# Patient Record
Sex: Female | Born: 1940
Health system: Southern US, Community
[De-identification: ages and names within clinical notes are randomized; demographics above are authoritative.]

## PROBLEM LIST (undated history)

## (undated) DIAGNOSIS — K469 Unspecified abdominal hernia without obstruction or gangrene: Secondary | ICD-10-CM

## (undated) DIAGNOSIS — M1990 Unspecified osteoarthritis, unspecified site: Secondary | ICD-10-CM

## (undated) DIAGNOSIS — E063 Autoimmune thyroiditis: Secondary | ICD-10-CM

## (undated) DIAGNOSIS — E669 Obesity, unspecified: Secondary | ICD-10-CM

## (undated) DIAGNOSIS — I7300 Raynaud's syndrome without gangrene: Secondary | ICD-10-CM

## (undated) DIAGNOSIS — F32A Depression, unspecified: Secondary | ICD-10-CM

## (undated) DIAGNOSIS — E785 Hyperlipidemia, unspecified: Secondary | ICD-10-CM

## (undated) DIAGNOSIS — F329 Major depressive disorder, single episode, unspecified: Secondary | ICD-10-CM

## (undated) DIAGNOSIS — R252 Cramp and spasm: Secondary | ICD-10-CM

## (undated) DIAGNOSIS — R42 Dizziness and giddiness: Secondary | ICD-10-CM

## (undated) DIAGNOSIS — T7840XA Allergy, unspecified, initial encounter: Secondary | ICD-10-CM

## (undated) DIAGNOSIS — G4730 Sleep apnea, unspecified: Secondary | ICD-10-CM

## (undated) DIAGNOSIS — M791 Myalgia, unspecified site: Secondary | ICD-10-CM

## (undated) DIAGNOSIS — R5383 Other fatigue: Secondary | ICD-10-CM

## (undated) DIAGNOSIS — D649 Anemia, unspecified: Secondary | ICD-10-CM

## (undated) DIAGNOSIS — R0602 Shortness of breath: Secondary | ICD-10-CM

## (undated) DIAGNOSIS — M2550 Pain in unspecified joint: Secondary | ICD-10-CM

## (undated) DIAGNOSIS — B029 Zoster without complications: Secondary | ICD-10-CM

## (undated) DIAGNOSIS — H9190 Unspecified hearing loss, unspecified ear: Secondary | ICD-10-CM

## (undated) HISTORY — DX: Hyperlipidemia, unspecified: E78.5

## (undated) HISTORY — DX: Raynaud's syndrome without gangrene: I73.00

## (undated) HISTORY — DX: Depression, unspecified: F32.A

## (undated) HISTORY — DX: Other fatigue: R53.83

## (undated) HISTORY — PX: KNEE ARTHROSCOPY: SHX127

## (undated) HISTORY — DX: Unspecified osteoarthritis, unspecified site: M19.90

## (undated) HISTORY — DX: Sleep apnea, unspecified: G47.30

## (undated) HISTORY — DX: Obesity, unspecified: E66.9

## (undated) HISTORY — DX: Anemia, unspecified: D64.9

## (undated) HISTORY — PX: APPENDECTOMY: SHX54

## (undated) HISTORY — DX: Myalgia, unspecified site: M79.10

## (undated) HISTORY — DX: Unspecified hearing loss, unspecified ear: H91.90

## (undated) HISTORY — DX: Allergy, unspecified, initial encounter: T78.40XA

## (undated) HISTORY — DX: Shortness of breath: R06.02

## (undated) HISTORY — DX: Autoimmune thyroiditis: E06.3

## (undated) HISTORY — DX: Pain in unspecified joint: M25.50

## (undated) HISTORY — DX: Unspecified abdominal hernia without obstruction or gangrene: K46.9

## (undated) HISTORY — PX: TONSILLECTOMY: SUR1361

## (undated) HISTORY — DX: Cramp and spasm: R25.2

## (undated) HISTORY — DX: Dizziness and giddiness: R42

## (undated) HISTORY — DX: Major depressive disorder, single episode, unspecified: F32.9

---
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
## 1990-07-18 HISTORY — PX: THROAT SURGERY: SHX803

## 2002-12-12 ENCOUNTER — Other Ambulatory Visit: Admission: RE | Admit: 2002-12-12 | Discharge: 2002-12-12 | Payer: Self-pay | Admitting: Obstetrics and Gynecology

## 2004-06-09 ENCOUNTER — Other Ambulatory Visit: Admission: RE | Admit: 2004-06-09 | Discharge: 2004-06-09 | Payer: Self-pay | Admitting: Obstetrics and Gynecology

## 2007-03-20 ENCOUNTER — Ambulatory Visit: Payer: Self-pay | Admitting: Family Medicine

## 2008-01-11 ENCOUNTER — Encounter: Admission: RE | Admit: 2008-01-11 | Discharge: 2008-01-11 | Payer: Self-pay | Admitting: Otolaryngology

## 2010-06-08 ENCOUNTER — Encounter (INDEPENDENT_AMBULATORY_CARE_PROVIDER_SITE_OTHER): Payer: Self-pay | Admitting: *Deleted

## 2010-08-08 ENCOUNTER — Encounter: Payer: Self-pay | Admitting: Otolaryngology

## 2010-08-17 NOTE — Letter (Signed)
Summary: Pre Visit Letter Revised  [facility]  [address] [place], [place] [address]   Phone: [phone]  Fax: [phone]        06/08/2010 MRN: [id number] Adriana Franklin [address] [place], [place]  [address]             Procedure Date:  06/29/2010  Welcome to the Gastroenterology Division at [facility].    You are scheduled to see a nurse for your pre-procedure visit on 06/18/2010 at 8:00AM on the 3rd floor at [facility], [address]  We ask that you try to arrive at our office 15 minutes prior to your appointment time to allow for check-in.  Please take a minute to review the attached form.  If you answer "Yes" to one or more of the questions on the first page, we ask that you call the person listed at your earliest opportunity.  If you answer "No" to all of the questions, please complete the rest of the form and bring it to your appointment.    Your nurse visit will consist of discussing your medical and surgical history, your immediate family medical history, and your medications.   If you are unable to list all of your medications on the form, please bring the medication bottles to your appointment and we will list them.  We will need to be aware of both prescribed and over the counter drugs.  We will need to know exact dosage information as well.    Please be prepared to read and sign documents such as consent forms, a financial agreement, and acknowledgement forms.  If necessary, and with your consent, a friend or relative is welcome to sit-in on the nurse visit with you.  Please bring your insurance card so that we may make a copy of it.  If your insurance requires a referral to see a specialist, please bring your referral form from your primary care physician.  No co-pay is required for this nurse visit.     If you cannot keep your appointment, please call [phone] to cancel or reschedule prior to your appointment date.  This allows  [place] the opportunity to schedule an appointment for another patient in need of care.    Thank you for choosing [facility] for your medical needs.  We appreciate the opportunity to care for you.  Please visit [place] at our website  to learn more about our practice.  Sincerely, The Gastroenterology Division

## 2011-01-31 ENCOUNTER — Ambulatory Visit
Admission: RE | Admit: 2011-01-31 | Discharge: 2011-01-31 | Disposition: A | Discharge status: Home / Self Care | Payer: Medicare Other | Source: Ambulatory Visit | Attending: Cardiology | Admitting: Cardiology

## 2011-01-31 ENCOUNTER — Other Ambulatory Visit: Payer: Self-pay | Admitting: Cardiology

## 2011-01-31 DIAGNOSIS — R0789 Other chest pain: Secondary | ICD-10-CM

## 2012-11-01 ENCOUNTER — Ambulatory Visit (INDEPENDENT_AMBULATORY_CARE_PROVIDER_SITE_OTHER): Payer: Medicare Other | Admitting: Nurse Practitioner

## 2012-11-01 ENCOUNTER — Encounter: Payer: Self-pay | Admitting: Nurse Practitioner

## 2012-11-01 VITALS — BP 136/82 | HR 62 | Temp 97.9°F | Resp 15 | Ht 63.5 in | Wt 206.2 lb

## 2012-11-01 DIAGNOSIS — H6120 Impacted cerumen, unspecified ear: Secondary | ICD-10-CM

## 2012-11-01 DIAGNOSIS — T7840XA Allergy, unspecified, initial encounter: Secondary | ICD-10-CM | POA: Insufficient documentation

## 2012-11-01 DIAGNOSIS — H6123 Impacted cerumen, bilateral: Secondary | ICD-10-CM

## 2012-11-01 DIAGNOSIS — R5383 Other fatigue: Secondary | ICD-10-CM | POA: Insufficient documentation

## 2012-11-01 DIAGNOSIS — R5381 Other malaise: Secondary | ICD-10-CM

## 2012-11-01 DIAGNOSIS — E785 Hyperlipidemia, unspecified: Secondary | ICD-10-CM | POA: Insufficient documentation

## 2012-11-01 DIAGNOSIS — E063 Autoimmune thyroiditis: Secondary | ICD-10-CM | POA: Insufficient documentation

## 2012-11-01 NOTE — Progress Notes (Signed)
Patient ID: Adriana Franklin, female   DOB: 1940-10-28, 72 y.o.   MRN: [id number] Code Status: DNR  Allergies  Allergen Reactions  . Onion     Chief Complaint  Patient presents with  . NP to Establish    HPI: Patient is a 72 y.o. female seen in the office today to establish care- previously seen at [facility] and reports she wanted to go somewhere closer to her home.   Has ongoing numbness in fingertips that last a few mins and then will go away. Reports her Muscles are weak and achy and she has had shoulder pain for the past few years Increased fatigue reports shehas endocrinologist that manages her thyroid   Review of Systems:  Review of Systems  Constitutional: Negative for fever, chills and weight loss.  HENT: Positive for hearing loss and congestion.   Eyes: Negative.   Respiratory: Positive for shortness of breath (with exertion- due to obesty). Negative for cough.   Cardiovascular: Negative for chest pain, palpitations and leg swelling.       Has had history of stress test and echo - done with Dr Sondra Come   Gastrointestinal: Positive for abdominal pain (gnawing pain that she associates with the need to eat). Negative for heartburn, diarrhea and constipation.  Genitourinary: Negative for dysuria, urgency and frequency.  Musculoskeletal: Positive for myalgias, back pain and joint pain.  Skin: Negative.   Neurological: Positive for dizziness and weakness (ongoing weakness and fatigue).  Psychiatric/Behavioral: Negative for depression. The patient has insomnia. The patient is not nervous/anxious.      Past Medical History  Diagnosis Date  . Hashimoto's disease   . Hyperlipidemia   . Fatigue   . Hernia   . Muscle pain   . Arthralgia   . Vertigo   . Anemia   . Allergy    Past Surgical History  Procedure Laterality Date  . Spine surgery  1946  . Appendectomy    . Tonsillectomy    . Knee arthroscopy Left    Social History:   reports that she has quit smoking. Her  smoking use included Cigarettes. She has a 20 pack-year smoking history. She does not have any smokeless tobacco history on file. She reports that she does not drink alcohol or use illicit drugs.  Family History  Problem Relation Age of Onset  . Heart disease Mother   . Hypertension Mother   . Heart disease Father     Medications: Patient's Medications  New Prescriptions   No medications on file  Previous Medications   ASPIRIN 81 MG TABLET    Take 81 mg by mouth daily.   LEVOTHYROXINE (SYNTHROID, LEVOTHROID) 112 MCG TABLET    Take 112 mcg by mouth every other day.   LEVOTHYROXINE (SYNTHROID, LEVOTHROID) 125 MCG TABLET    Take 125 mcg by mouth every other day.  Modified Medications   No medications on file  Discontinued Medications   ASCORBIC ACID (VITAMIN C) 100 MG TABLET    Take one tablet once daily   CHOLECALCIFEROL (VITAMIN D) 1000 UNITS TABLET    Take 1,000 Units by mouth daily.   COD LIVER OIL PO    Take one tablet once daily   COENZYME Q10 (CO Q 10 PO)    Take one tablet once daily   LECITHIN PO    Take one tablet once daily   SYNTHROID 112 MCG TABLET    Take one tablet every other day for thryoid   VITAMIN A PO  Take one tablet once daily     Physical Exam: Physical Exam  Constitutional: She is oriented to person, place, and time. She appears well-developed and well-nourished. No distress.  HENT:  Head: Normocephalic and atraumatic.  Right Ear: External ear normal.  Left Ear: External ear normal.  impacted cerumen bilaterally   Eyes: EOM are normal. Pupils are equal, round, and reactive to light.  Neck: Normal range of motion. Neck supple.  Cardiovascular: Normal rate, regular rhythm and normal heart sounds.   Pulmonary/Chest: Effort normal and breath sounds normal.  Abdominal: Soft. Bowel sounds are normal.  Musculoskeletal: Normal range of motion.  Neurological: She is alert and oriented to person, place, and time.  Skin: Skin is warm and dry. She is not  diaphoretic.  Psychiatric: She has a normal mood and affect.    Filed Vitals:   11/01/12 0921  BP: 136/82  Pulse: 62  Temp: 97.9 F (36.6 C)  TempSrc: Oral  Resp: 15  Height: 5' 3.5" (1.613 m)  Weight: 206 lb 3.2 oz (93.532 kg)     Assessment/Plan Hashimoto's disease Cont on thyroid medication as prescribed will check tsh before next visit  Other and unspecified hyperlipidemia Encouraged lifestyle modifications. Will check fasting lipids before next visit.   Cerumen impaction Ear washes done bilaterally pt tolerated well   Morbid obesity Encouraged lifestyle modifications- will follow weight and labs   Allergy May use OTC claritin   Fatigue Pt reports she should be using breath right strips at night that helps her sleep better- will try this over the next month- will get lab work as well as lifestyle modifications to help fatigue. May need to have sleep apnea study repeated      Labs/tests ordered   TSH, fasting lipids, CMP, CBC

## 2012-11-01 NOTE — Assessment & Plan Note (Signed)
Cont on thyroid medication as prescribed will check tsh before next visit

## 2012-11-01 NOTE — Patient Instructions (Addendum)
Loratadine (claritin) 10mg  daily for sinuses  Do at least 30 mins of exercise 5 days a week for good heart health  Use breath right strips every night Goal is good sleep, proper exercise and healthy diet   Will check blood work before next visit which will be a physical  Cardiac Diet This diet can help prevent heart disease and stroke. Many factors influence your heart health, including eating and exercise habits. Coronary risk rises a lot with abnormal blood fat (lipid) levels. Cardiac meal planning includes limiting unhealthy fats, increasing healthy fats, and making other small dietary changes. General guidelines are as follows:  Adjust calorie intake to reach and maintain desirable body weight.  Limit total fat intake to less than 30% of total calories. Saturated fat should be less than 7% of calories.  Saturated fats are found in animal products and in some vegetable products. Saturated vegetable fats are found in coconut oil, cocoa butter, palm oil, and palm kernel oil. Read labels carefully to avoid these products as much as possible. Use butter in moderation. Choose tub margarines and oils that have 2 grams of fat or less. Good cooking oils are canola and olive oils.  Practice low-fat cooking techniques. Do not fry food. Instead, broil, bake, boil, steam, grill, roast on a rack, stir-fry, or microwave it. Other fat reducing suggestions include:  Remove the skin from poultry.  Remove all visible fat from meats.  Skim the fat off stews, soups, and gravies before serving them.  Steam vegetables in water or broth instead of sauting them in fat.  Avoid foods with trans fat (or hydrogenated oils), such as commercially fried foods and commercially baked goods. Commercial shortening and deep-frying fats will contain trans fat.  Increase intake of fruits, vegetables, whole grains, and legumes to replace foods high in fat.  Increase consumption of nuts, legumes, and seeds to at least 4  servings weekly. One serving of a legume equals  cup, and 1 serving of nuts or seeds equals  cup.  Choose whole grains more often. Have 3 servings per day (a serving is 1 ounce [oz]).  Eat 4 to 5 servings of vegetables per day. A serving of vegetables is 1 cup of raw leafy vegetables;  cup of raw or cooked cut-up vegetables;  cup of vegetable juice.  Eat 4 to 5 servings of fruit per day. A serving of fruit is 1 medium whole fruit;  cup of dried fruit;  cup of fresh, frozen, or canned fruit;  cup of 100% fruit juice.  Increase your intake of dietary fiber to 20 to 30 grams per day. Insoluble fiber may help lower your risk of heart disease and may help curb your appetite. Soluble fiber binds cholesterol to be removed from the blood. Foods high in soluble fiber are dried beans, citrus fruits, oats, apples, bananas, broccoli, Brussels sprouts, and eggplant.  Try to include foods fortified with plant sterols or stanols, such as yogurt, breads, juices, or margarines. Choose several fortified foods to achieve a daily intake of 2 to 3 grams of plant sterols or stanols.  Foods with omega-3 fats can help reduce your risk of heart disease. Aim to have a 3.5 oz portion of fatty fish twice per week, such as salmon, mackerel, albacore tuna, sardines, lake trout, or herring. If you wish to take a fish oil supplement, choose one that contains 1 gram of both DHA and EPA.  Limit processed meats to 2 servings (3 oz portion) weekly.  Limit  the sodium in your diet to 1500 milligrams (mg) per day. If you have high blood pressure, talk to a registered dietitian about a DASH (Dietary Approaches to Stop Hypertension) eating plan.  Limit sweets and beverages with added sugar, such as soda, to no more than 5 servings per week. One serving is:   1 tablespoon sugar.  1 tablespoon jelly or jam.   cup sorbet.  1 cup lemonade.   cup regular soda. CHOOSING FOODS Starches  Allowed: Breads: All kinds (wheat,  rye, raisin, white, oatmeal, [place], [place], and English muffin bread). Low-fat rolls: English muffins, frankfurter and hamburger buns, bagels, pita bread, tortillas (not fried). Pancakes, waffles, biscuits, and muffins made with recommended oil.  Avoid: Products made with saturated or trans fats, oils, or whole milk products. Butter rolls, cheese breads, croissants. Commercial doughnuts, muffins, sweet rolls, biscuits, waffles, pancakes, store-bought mixes. Crackers  Allowed: Low-fat crackers and snacks: Animal, graham, rye, saltine (with recommended oil, no lard), oyster, and matzo crackers. Bread sticks, melba toast, rusks, flatbread, pretzels, and light popcorn.  Avoid: High-fat crackers: cheese crackers, butter crackers, and those made with coconut, palm oil, or trans fat (hydrogenated oils). Buttered popcorn. Cereals  Allowed: Hot or cold whole-grain cereals.  Avoid: Cereals containing coconut, hydrogenated vegetable fat, or animal fat. Potatoes / Pasta / Rice  Allowed: All kinds of potatoes, rice, and pasta (such as macaroni, spaghetti, and noodles).  Avoid: Pasta or rice prepared with cream sauce or high-fat cheese. Chow mein noodles, [place] fries. Vegetables  Allowed: All vegetables and vegetable juices.  Avoid: Fried vegetables. Vegetables in cream, butter, or high-fat cheese sauces. Limit coconut. Fruit in cream or custard. Protein  Allowed: Limit your intake of meat, seafood, and poultry to no more than 6 oz (cooked weight) per day. All lean, well-trimmed beef, veal, pork, and lamb. All chicken and [place] without skin. All fish and shellfish. Wild game: wild duck, rabbit, pheasant, and venison. Egg whites or low-cholesterol egg substitutes may be used as desired. Meatless dishes: recipes with dried beans, peas, lentils, and tofu (soybean curd). Seeds and nuts: all seeds and most nuts.  Avoid: Prime grade and other heavily marbled and fatty meats, such as short ribs, spare  ribs, rib eye roast or steak, frankfurters, sausage, bacon, and high-fat luncheon meats, mutton. Caviar. Commercially fried fish. Domestic duck, goose, venison sausage. Organ meats: liver, gizzard, heart, chitterlings, brains, kidney, sweetbreads. Dairy  Allowed: Low-fat cheeses: nonfat or low-fat cottage cheese (1% or 2% fat), cheeses made with part skim milk, such as mozzarella, farmers, string, or ricotta. (Cheeses should be labeled no more than 2 to 6 grams fat per oz.). Skim (or 1%) milk: liquid, powdered, or evaporated. Buttermilk made with low-fat milk. Drinks made with skim or low-fat milk or cocoa. Chocolate milk or cocoa made with skim or low-fat (1%) milk. Nonfat or low-fat yogurt.  Avoid: Whole milk cheeses, including colby, cheddar, [place], [address], [place], [place], [place], [address], [place], and blue. Creamed cottage cheese, cream cheese. Whole milk and whole milk products, including buttermilk or yogurt made from whole milk, drinks made from whole milk. Condensed milk, evaporated whole milk, and 2% milk. Soups and Combination Foods  Allowed: Low-fat low-sodium soups: broth, dehydrated soups, homemade broth, soups with the fat removed, homemade cream soups made with skim or low-fat milk. Low-fat spaghetti, lasagna, chili, and Spanish rice if low-fat ingredients and low-fat cooking techniques are used.  Avoid: Cream soups made with whole milk, cream, or high-fat cheese. All other soups. Desserts  and Sweets  Allowed: Sherbet, fruit ices, gelatins, meringues, and angel food cake. Homemade desserts with recommended fats, oils, and milk products. Jam, jelly, honey, marmalade, sugars, and syrups. Pure sugar candy, such as gum drops, hard candy, jelly beans, marshmallows, mints, and small amounts of dark chocolate.  Avoid: Commercially prepared cakes, pies, cookies, frosting, pudding, or mixes for these products. Desserts containing whole milk products, chocolate, coconut, lard, palm  oil, or palm kernel oil. Ice cream or ice cream drinks. Candy that contains chocolate, coconut, butter, hydrogenated fat, or unknown ingredients. Buttered syrups. Fats and Oils  Allowed: Vegetable oils: safflower, sunflower, corn, soybean, cottonseed, sesame, canola, olive, or peanut. Non-hydrogenated margarines. Salad dressing or mayonnaise: homemade or commercial, made with a recommended oil. Low or nonfat salad dressing or mayonnaise.  Limit added fats and oils to 6 to 8 tsp per day (includes fats used in cooking, baking, salads, and spreads on bread). Remember to count the "hidden fats" in foods.  Avoid: Solid fats and shortenings: butter, lard, salt pork, bacon drippings. Gravy containing meat fat, shortening, or suet. Cocoa butter, coconut. Coconut oil, palm oil, palm kernel oil, or hydrogenated oils: these ingredients are often used in bakery products, nondairy creamers, whipped toppings, candy, and commercially fried foods. Read labels carefully. Salad dressings made of unknown oils, sour cream, or cheese, such as blue cheese and [place]ef[place]. Cream, all kinds: half-and-half, light, heavy, or whipping. Sour cream or cream cheese (even if "light" or low-fat). Nondairy cream substitutes: coffee creamers and sour cream substitutes made with palm, palm kernel, hydrogenated oils, or coconut oil. Beverages  Allowed: Coffee (regular or decaffeinated), tea. Diet carbonated beverages, mineral water. Alcohol: Check with your caregiver. Moderation is recommended.  Avoid: Whole milk, regular sodas, and juice drinks with added sugar. Condiments  Allowed: All seasonings and condiments. Cocoa powder. "Cream" sauces made with recommended ingredients.  Avoid: Carob powder made with hydrogenated fats. SAMPLE MENU Breakfast   cup orange juice   cup oatmeal  1 slice toast  1 tsp margarine  1 cup skim milk Lunch  [place] sandwich with 2 oz [place], 2 slices bread  Lettuce and tomato slices  Fresh  fruit  Carrot sticks  Coffee or tea Snack  Fresh fruit or low-fat crackers Dinner  3 oz lean ground beef  1 baked potato  1 tsp margarine   cup asparagus  Lettuce salad  1 tbs non-creamy dressing   cup peach slices  1 cup skim milk Document Released: 04/12/2008 Document Revised: 01/03/2012 Document Reviewed: 09/27/2011 [facility] Patient Information 2013 [place], [place].

## 2012-11-01 NOTE — Assessment & Plan Note (Signed)
Encouraged lifestyle modifications. Will check fasting lipids before next visit.

## 2012-11-05 DIAGNOSIS — H6120 Impacted cerumen, unspecified ear: Secondary | ICD-10-CM | POA: Insufficient documentation

## 2012-11-05 NOTE — Assessment & Plan Note (Signed)
Encouraged lifestyle modifications- will follow weight and labs

## 2012-11-05 NOTE — Assessment & Plan Note (Signed)
May use OTC claritin

## 2012-11-05 NOTE — Assessment & Plan Note (Signed)
Pt reports she should be using breath right strips at night that helps her sleep better- will try this over the next month- will get lab work as well as lifestyle modifications to help fatigue. May need to have sleep apnea study repeated

## 2012-11-05 NOTE — Assessment & Plan Note (Signed)
Ear washes done bilaterally pt tolerated well

## 2012-11-27 ENCOUNTER — Other Ambulatory Visit: Payer: Medicare Other

## 2012-11-27 DIAGNOSIS — R5383 Other fatigue: Secondary | ICD-10-CM

## 2012-11-27 DIAGNOSIS — E785 Hyperlipidemia, unspecified: Secondary | ICD-10-CM

## 2012-11-28 LAB — CBC WITH DIFFERENTIAL/PLATELET
Basos: 1 % (ref 0–3)
Eosinophils Absolute: 0.1 10*3/uL (ref 0.0–0.4)
Immature Grans (Abs): 0 10*3/uL (ref 0.0–0.1)
Immature Granulocytes: 0 % (ref 0–2)
Lymphs: 43 % (ref 14–46)
MCH: 24.4 pg — ABNORMAL LOW (ref 26.6–33.0)
MCV: 75 fL — ABNORMAL LOW (ref 79–97)
Monocytes Absolute: 0.3 10*3/uL (ref 0.1–0.9)
Neutrophils Relative %: 44 % (ref 40–74)
RBC: 4.99 x10E6/uL (ref 3.77–5.28)
RDW: 17 % — ABNORMAL HIGH (ref 12.3–15.4)
WBC: 3.8 10*3/uL (ref 3.4–10.8)

## 2012-11-28 LAB — COMPREHENSIVE METABOLIC PANEL
ALT: 17 IU/L (ref 0–32)
AST: 23 IU/L (ref 0–40)
CO2: 25 mmol/L (ref 19–28)
Calcium: 9.2 mg/dL (ref 8.6–10.2)
Chloride: 101 mmol/L (ref 97–108)
GFR calc non Af Amer: 71 mL/min/{1.73_m2} (ref >59)
Glucose: 110 mg/dL — ABNORMAL HIGH (ref 65–99)
Potassium: 4.2 mmol/L (ref 3.5–5.2)
Sodium: 139 mmol/L (ref 134–144)
Total Protein: 6.9 g/dL (ref 6.0–8.5)

## 2012-11-28 LAB — LIPID PANEL
Chol/HDL Ratio: 7.5 ratio units — ABNORMAL HIGH (ref 0.0–4.4)
Cholesterol, Total: 276 mg/dL — ABNORMAL HIGH (ref 100–199)
HDL: 37 mg/dL — ABNORMAL LOW (ref >39)
Triglycerides: 243 mg/dL — ABNORMAL HIGH (ref 0–149)

## 2012-11-28 LAB — TSH: TSH: 4.16 u[IU]/mL (ref 0.450–4.500)

## 2012-11-29 ENCOUNTER — Encounter: Payer: Self-pay | Admitting: Nurse Practitioner

## 2012-11-29 ENCOUNTER — Other Ambulatory Visit: Payer: Medicare Other | Admitting: Nurse Practitioner

## 2012-11-29 ENCOUNTER — Ambulatory Visit (INDEPENDENT_AMBULATORY_CARE_PROVIDER_SITE_OTHER): Payer: Medicare Other | Admitting: Nurse Practitioner

## 2012-11-29 VITALS — BP 132/84 | HR 88 | Temp 98.2°F | Resp 14 | Ht 63.5 in | Wt 205.2 lb

## 2012-11-29 DIAGNOSIS — K219 Gastro-esophageal reflux disease without esophagitis: Secondary | ICD-10-CM

## 2012-11-29 DIAGNOSIS — E785 Hyperlipidemia, unspecified: Secondary | ICD-10-CM

## 2012-11-29 DIAGNOSIS — T7589XS Other specified effects of external causes, sequela: Secondary | ICD-10-CM

## 2012-11-29 DIAGNOSIS — E063 Autoimmune thyroiditis: Secondary | ICD-10-CM

## 2012-11-29 DIAGNOSIS — R7309 Other abnormal glucose: Secondary | ICD-10-CM

## 2012-11-29 DIAGNOSIS — M542 Cervicalgia: Secondary | ICD-10-CM

## 2012-11-29 DIAGNOSIS — T7840XS Allergy, unspecified, sequela: Secondary | ICD-10-CM

## 2012-11-29 DIAGNOSIS — R739 Hyperglycemia, unspecified: Secondary | ICD-10-CM

## 2012-11-29 DIAGNOSIS — T788XXS Other adverse effects, not elsewhere classified, sequela: Secondary | ICD-10-CM

## 2012-11-29 MED ORDER — SIMVASTATIN 20 MG PO TABS: ORAL_TABLET | ORAL | Status: DC

## 2012-11-29 NOTE — Assessment & Plan Note (Signed)
Unchanged- did not like taking Claritin so she did not cont taking this

## 2012-11-29 NOTE — Assessment & Plan Note (Signed)
TSH is good.

## 2012-11-29 NOTE — Patient Instructions (Addendum)
Will follow up in 3 months after being on medications for cholesterol to recheck labs before visit    [facility] for hearing studies Fish oil 4000 mg daily - keep in refrigerator and take a night  Zocor 10 mg daily for 7 days if no side effects then increase to 20 mg daily Will consult GI  Will consult therapy to help guide exercise for strengthen for neck and left should pain

## 2012-11-29 NOTE — Progress Notes (Signed)
Patient ID: Adriana Franklin, female   DOB: [birth year], 72 y.o.   MRN: [id number] Code Status: DNR   Allergies  Allergen Reactions  . Onion     Chief Complaint  Patient presents with  . Annual Exam    Complains of Nausea    HPI: Patient is a 72 y.o. female seen in the office today for extended visit  Currently having vertigo; took meclizine before visit. Does not wish to do extended visit today due to dizziness. Reports she has been back in the gym, prepares her own meals and reports they are heart healthy however admits her husband likes fried food.    Review of Systems:  Review of Systems  Constitutional: Negative for fever, chills and malaise/fatigue.  Gastrointestinal: Positive for heartburn. Negative for abdominal pain, diarrhea and constipation.       Has hemorrhoids   Genitourinary: Negative for dysuria, urgency and frequency.  Musculoskeletal: Positive for back pain and joint pain. Negative for falls.       Pain in neck and shoulder   Skin: Negative for itching and rash.  Neurological: Positive for dizziness (takes meclizine which helps ). Negative for weakness and headaches.  Endo/Heme/Allergies: Positive for environmental allergies.  Psychiatric/Behavioral: Negative for depression and memory loss. The patient is not nervous/anxious.       Past Medical History  Diagnosis Date  . Hashimoto's disease   . Hyperlipidemia   . Fatigue   . Hernia   . Muscle pain   . Arthralgia   . Vertigo   . Anemia   . Allergy    Past Surgical History  Procedure Laterality Date  . Spine surgery  1946  . Appendectomy    . Tonsillectomy    . Knee arthroscopy Left    Social History:   reports that she has quit smoking. Her smoking use included Cigarettes. She has a 20 pack-year smoking history. She does not have any smokeless tobacco history on file. She reports that she does not drink alcohol or use illicit drugs.  Family History  Problem Relation Age of Onset  . Heart  disease Mother   . Hypertension Mother   . Heart disease Father     Medications: Patient's Medications  New Prescriptions   SIMVASTATIN (ZOCOR) 20 MG TABLET    1/2 tablet for 1 week then increase to 1 tablet nightly  Previous Medications   ASPIRIN 81 MG TABLET    Take 81 mg by mouth daily.   LEVOTHYROXINE (SYNTHROID, LEVOTHROID) 112 MCG TABLET    Take 112 mcg by mouth every other day.   LEVOTHYROXINE (SYNTHROID, LEVOTHROID) 125 MCG TABLET    Take 125 mcg by mouth every other day.  Modified Medications   No medications on file  Discontinued Medications   No medications on file     Physical Exam:  Filed Vitals:   11/29/12 1122  BP: 132/84  Pulse: 88  Temp: 98.2 F (36.8 C)  TempSrc: Oral  Resp: 14  Height: 5' 3.5" (1.613 m)  Weight: 205 lb 3.2 oz (93.078 kg)    Physical Exam  Constitutional: She is oriented to person, place, and time. She appears well-developed and well-nourished. No distress.  HENT:  Head: Normocephalic and atraumatic.  Eyes: Conjunctivae and EOM are normal. Pupils are equal, round, and reactive to light.  Musculoskeletal: Normal range of motion. She exhibits tenderness (to back of neck and right shoulder.). She exhibits no edema.  Neurological: She is alert and oriented to person, place, and  time.  Skin: Skin is warm and dry. She is not diaphoretic.  Psychiatric: She has a normal mood and affect.     Labs reviewed: Basic Metabolic Panel:  Recent Labs  16/10/96 0807  NA 139  K 4.2  CL 101  CO2 25  GLUCOSE 110*  BUN 14  CREATININE 0.84  CALCIUM 9.2  TSH 4.160   Liver Function Tests:  Recent Labs  11/27/12 0807  AST 23  ALT 17  ALKPHOS 83  BILITOT 0.7  PROT 6.9   No results found for this basename: LIPASE, AMYLASE,  in the last 8760 hours No results found for this basename: AMMONIA,  in the last 8760 hours CBC:  Recent Labs  11/27/12 0807  WBC 3.8  NEUTROABS 1.7  HGB 12.2  HCT 37.4  MCV 75*   Lipid Panel:  Recent  Labs  11/27/12 0807  HDL 37*  LDLCALC 190*  TRIG 243*  CHOLHDL 7.5*    Past Procedures:     Assessment/Plan Allergy Unchanged- did not like taking Claritin so she did not cont taking this    Hashimoto's disease TSH is good   Other and unspecified hyperlipidemia Re-educated about lifestyle modifications- will start medication at this time. simvastatin 10 mg ordered for 1 week then to increase to 20 mg daily and fish oil daily. To follow up in 3 months with lab work before.   GERD (gastroesophageal reflux disease) Does not wish to be on any medication at this time. Education given however request GI referral due to known HH and ongoing GERD. Will consult GI at this time.  Neck pain Will consult outpt therapy for neck and shoulder pain   Health maintenance -  Education given regarding routine health maintenance however pt declines colonoscopy, home FOBT, mammogram, breast exam, Zosta-Vac and pneumococcal vaccine.  Reports she has had Dexa Scan in the past and will get records of when and where.  Will get PAP done before next visit

## 2012-11-30 ENCOUNTER — Encounter: Payer: Self-pay | Admitting: Internal Medicine

## 2012-12-01 NOTE — Assessment & Plan Note (Signed)
Does not wish to be on any medication at this time. Education given however request GI referral due to known HH and ongoing GERD. Will consult GI at this time.

## 2012-12-01 NOTE — Assessment & Plan Note (Signed)
Will consult outpt therapy for neck and shoulder pain

## 2012-12-01 NOTE — Assessment & Plan Note (Addendum)
Re-educated about lifestyle modifications- will start medication at this time. simvastatin 10 mg ordered for 1 week then to increase to 20 mg daily and fish oil daily. To follow up in 3 months with lab work before.

## 2012-12-12 ENCOUNTER — Ambulatory Visit: Payer: Medicare Other | Admitting: Physical Therapy

## 2012-12-18 ENCOUNTER — Encounter: Payer: Self-pay | Admitting: Internal Medicine

## 2012-12-19 ENCOUNTER — Encounter: Payer: Self-pay | Admitting: Internal Medicine

## 2012-12-19 ENCOUNTER — Ambulatory Visit (INDEPENDENT_AMBULATORY_CARE_PROVIDER_SITE_OTHER): Payer: Medicare Other | Admitting: Internal Medicine

## 2012-12-19 VITALS — BP 118/78 | HR 88 | Ht 63.0 in | Wt 205.4 lb

## 2012-12-19 DIAGNOSIS — K219 Gastro-esophageal reflux disease without esophagitis: Secondary | ICD-10-CM

## 2012-12-19 DIAGNOSIS — Z1211 Encounter for screening for malignant neoplasm of colon: Secondary | ICD-10-CM

## 2012-12-19 DIAGNOSIS — K649 Unspecified hemorrhoids: Secondary | ICD-10-CM

## 2012-12-19 DIAGNOSIS — R1310 Dysphagia, unspecified: Secondary | ICD-10-CM

## 2012-12-19 NOTE — Progress Notes (Signed)
Patient ID: Adriana Franklin, female   DOB: Feb 17, 1941, 72 y.o.   MRN: [id number] HPI: Adriana Franklin is a 72 yo female with PMH of GERD, Hashimoto's thyroiditis and hypothyroidism, obesity who seen in consultation at the request of Candelaria Celeste, CNP for evaluation of GERD and intermittent dysphagia. The patient reports she has a long history of a "queasy stomach". She reports she was diagnosed with a hiatal hernia in the 1970s by barium swallow. She occasionally has atypical chest pain which is relieved by Tagamet. She reports having had a previous cardiology evaluation one year ago with echocardiogram and stress test. She reports she had a mildly enlarged heart but no evidence for CAD. She reports occasional solid food dysphagia with foods such as peanut butter. Pills on occasion also have trouble going down. She denies liquid dysphagia. For the most part solids pass easily. She has very rare nausea but no vomiting. She is not taking anything currently for heartburn or dyspepsia. She reports normal bowel habits without diarrhea or constipation. She does have bright red rectal bleeding which she attributes to her long standing hemorrhoids. She denies any rectal pain. No tenesmus. She considered surgery for hemorrhoids years ago and reports they were injected twice with no benefit.  She recalls a colonoscopy from 1994 but wanted to avoid another one due to the large volume prep required.  Patient Active Problem List   Diagnosis Date Noted  . GERD (gastroesophageal reflux disease) 11/29/2012  . Neck pain 11/29/2012  . Cerumen impaction 11/05/2012  . Other and unspecified hyperlipidemia 11/01/2012  . Hashimoto's disease 11/01/2012  . Morbid obesity 11/01/2012  . Allergy   . Fatigue     Past Surgical History  Procedure Laterality Date  . Spine surgery  1946  . Appendectomy    . Tonsillectomy    . Knee arthroscopy Left   . Throat surgery  1992    for sleep apnea treatment    Current Outpatient  Prescriptions  Medication Sig Dispense Refill  . aspirin 81 MG tablet Take 81 mg by mouth daily.      Marland Kitchen levothyroxine (SYNTHROID, LEVOTHROID) 112 MCG tablet Take 112 mcg by mouth every other day.       No current facility-administered medications for this visit.    Allergies  Allergen Reactions  . Onion     Family History  Problem Relation Age of Onset  . Hypertension Mother   . Heart disease Father     History  Substance Use Topics  . Smoking status: Former Smoker -- 2.00 packs/day for 10 years    Types: Cigarettes  . Smokeless tobacco: Not on file  . Alcohol Use: No    ROS: As per history of present illness, otherwise negative  BP 118/78  Pulse 88  Ht 5\' 3"  (1.6 m)  Wt 205 lb 6 oz (93.157 kg)  BMI 36.39 kg/m2 Constitutional: Well-developed and well-nourished. No distress. HEENT: Normocephalic and atraumatic. Oropharynx is clear and moist. No oropharyngeal exudate. Conjunctivae are normal.  No scleral icterus. Neck: Neck supple. Trachea midline. Cardiovascular: Normal rate, regular rhythm and intact distal pulses.  Pulmonary/chest: Effort normal and somewhat distant breath sounds normal. No wheezing, rales or rhonchi. Abdominal: Soft, nontender, nondistended. Bowel sounds active throughout. There are no masses palpable. Diastases recti Extremities: no clubbing, cyanosis, or edema Neurological: Alert and oriented to person place and time. Skin: Skin is warm and dry. No rashes noted. Psychiatric: Normal mood and affect. Behavior is normal.  RELEVANT LABS AND IMAGING:  CBC    Component Value Date/Time   WBC 3.8 11/27/2012 0807   RBC 4.99 11/27/2012 0807   HGB 12.2 11/27/2012 0807   HCT 37.4 11/27/2012 0807   MCV 75* 11/27/2012 0807   MCH 24.4* 11/27/2012 0807   MCHC 32.6 11/27/2012 0807   RDW 17.0* 11/27/2012 0807   LYMPHSABS 1.6 11/27/2012 0807   EOSABS 0.1 11/27/2012 0807   BASOSABS 0.0 11/27/2012 0807    CMP     Component Value Date/Time   NA 139 11/27/2012 0807    K 4.2 11/27/2012 0807   CL 101 11/27/2012 0807   CO2 25 11/27/2012 0807   GLUCOSE 110* 11/27/2012 0807   BUN 14 11/27/2012 0807   CREATININE 0.84 11/27/2012 0807   CALCIUM 9.2 11/27/2012 0807   PROT 6.9 11/27/2012 0807   AST 23 11/27/2012 0807   ALT 17 11/27/2012 0807   ALKPHOS 83 11/27/2012 0807   BILITOT 0.7 11/27/2012 0807   GFRNONAA 71 11/27/2012 0807   GFRAA 81 11/27/2012 0807    ASSESSMENT/PLAN:  72 yo female with PMH of GERD, Hashimoto's thyroiditis and hypothyroidism, obesity who seen in consultation at the request of Candelaria Celeste, CNP for evaluation of GERD and intermittent dysphagia.   1.  GERD/intermittent dysphagia -- given the patient's long-standing reflux disease and intermittent dysphagia, I recommended upper endoscopy. We discussed the test today including the risks and benefits and she is agreeable to proceed. I did consider initiation of PPI therapy, but she prefers to proceed with the upper endoscopy first to determine if this medication is absolutely necessary. Further recommendations after endoscopy  2.  CRC screening -- she is average risk and he now for screening colonoscopy. We discussed the test today at length including the risks and benefits and she is agreeable to proceed. She has asked for a lower volume prep, and thus we will prep with Prepopik.  I have encouraged her to drink additional clear fluids when taking the active medication to ensure adequate preparation.  3.  Hemorrhoid/rectal bleeding -- she feels confident her bleeding is from hemorrhoids. We will proceed with colonoscopy as discussed #2 to help rule out other colonic pathology. If hemorrhoids are seen we can recommend treatment at that time

## 2012-12-19 NOTE — Patient Instructions (Addendum)
You have been scheduled for a colonoscopy/endoscopy with propofol. Please follow written instructions given to you at your visit today.  Please pick up your prep kit at the pharmacy within the next 1-3 days. If you use inhalers (even only as needed), please bring them with you on the day of your procedure. Your physician has requested that you go to [email or web address] and enter the access code given to you at your visit today. This web site gives a general overview about your procedure. However, you should still follow specific instructions given to you by our office regarding your preparation for the procedure.                                               We are excited to introduce MyChart, a new best-in-class service that provides you online access to important information in your electronic medical record. We want to make it easier for you to view your health information - all in one secure location - when and where you need it. We expect MyChart will enhance the quality of care and service we provide.  When you register for MyChart, you can:    View your test results.    Request appointments and receive appointment reminders via email.    Request medication renewals.    View your medical history, allergies, medications and immunizations.    Communicate with your physician's office through a password-protected site.    Conveniently print information such as your medication lists.  To find out if MyChart is right for you, please talk to a member of our clinical staff today. We will gladly answer your questions about this free health and wellness tool.  If you are age 70 or older and want a member of your family to have access to your record, you must provide written consent by completing a proxy form available at our office. Please speak to our clinical staff about guidelines regarding accounts for patients younger than age 70.  As you activate your MyChart account and need any  technical assistance, please call the MyChart technical support line at [phone]) or email your question to [email or web address]. If you email your question(s), please include your name, a return phone number and the best time to reach you.  If you have non-urgent health-related questions, you can send a message to our office through MyChart at [email or web address]. If you have a medical emergency, call 911.  Thank you for using MyChart as your new health and wellness resource!   MyChart licensed from [employer],  [id number]. Patents Pending.

## 2012-12-20 ENCOUNTER — Ambulatory Visit: Payer: Medicare Other | Attending: Nurse Practitioner | Admitting: Physical Therapy

## 2012-12-20 DIAGNOSIS — M542 Cervicalgia: Secondary | ICD-10-CM | POA: Insufficient documentation

## 2012-12-20 DIAGNOSIS — M25519 Pain in unspecified shoulder: Secondary | ICD-10-CM | POA: Insufficient documentation

## 2012-12-20 DIAGNOSIS — IMO0001 Reserved for inherently not codable concepts without codable children: Secondary | ICD-10-CM | POA: Insufficient documentation

## 2013-01-01 ENCOUNTER — Ambulatory Visit: Payer: Medicare Other | Admitting: Physical Therapy

## 2013-01-02 ENCOUNTER — Ambulatory Visit: Payer: Medicare Other | Admitting: Physical Therapy

## 2013-01-07 ENCOUNTER — Encounter: Payer: Medicare Other | Admitting: Physical Therapy

## 2013-01-09 ENCOUNTER — Ambulatory Visit: Payer: Medicare Other | Admitting: Physical Therapy

## 2013-01-15 ENCOUNTER — Ambulatory Visit: Payer: Medicare Other | Attending: Nurse Practitioner | Admitting: Physical Therapy

## 2013-01-15 DIAGNOSIS — M25519 Pain in unspecified shoulder: Secondary | ICD-10-CM | POA: Insufficient documentation

## 2013-01-15 DIAGNOSIS — IMO0001 Reserved for inherently not codable concepts without codable children: Secondary | ICD-10-CM | POA: Insufficient documentation

## 2013-01-15 DIAGNOSIS — M542 Cervicalgia: Secondary | ICD-10-CM | POA: Insufficient documentation

## 2013-01-17 ENCOUNTER — Ambulatory Visit: Payer: Medicare Other | Admitting: Physical Therapy

## 2013-01-22 ENCOUNTER — Ambulatory Visit: Payer: Medicare Other | Admitting: Physical Therapy

## 2013-01-24 ENCOUNTER — Ambulatory Visit: Payer: Medicare Other | Admitting: Physical Therapy

## 2013-01-28 ENCOUNTER — Ambulatory Visit: Payer: Medicare Other | Admitting: Physical Therapy

## 2013-01-31 ENCOUNTER — Encounter: Payer: Medicare Other | Admitting: Physical Therapy

## 2013-02-04 ENCOUNTER — Encounter: Payer: Medicare Other | Admitting: Internal Medicine

## 2013-02-05 ENCOUNTER — Encounter: Payer: Medicare Other | Admitting: Physical Therapy

## 2013-02-07 ENCOUNTER — Encounter: Payer: Medicare Other | Admitting: Physical Therapy

## 2013-02-20 ENCOUNTER — Other Ambulatory Visit: Payer: Self-pay

## 2013-02-25 ENCOUNTER — Other Ambulatory Visit: Payer: Self-pay | Admitting: *Deleted

## 2013-02-25 ENCOUNTER — Other Ambulatory Visit: Payer: Medicare Other

## 2013-02-25 DIAGNOSIS — D649 Anemia, unspecified: Secondary | ICD-10-CM

## 2013-02-25 DIAGNOSIS — E1159 Type 2 diabetes mellitus with other circulatory complications: Secondary | ICD-10-CM

## 2013-02-25 DIAGNOSIS — E063 Autoimmune thyroiditis: Secondary | ICD-10-CM

## 2013-02-25 DIAGNOSIS — E785 Hyperlipidemia, unspecified: Secondary | ICD-10-CM

## 2013-02-25 DIAGNOSIS — R739 Hyperglycemia, unspecified: Secondary | ICD-10-CM

## 2013-02-26 LAB — COMPREHENSIVE METABOLIC PANEL
ALT: 20 IU/L (ref 0–32)
Albumin: 4.2 g/dL (ref 3.5–4.8)
BUN: 10 mg/dL (ref 8–27)
CO2: 24 mmol/L (ref 18–29)
Calcium: 9 mg/dL (ref 8.6–10.2)
Chloride: 101 mmol/L (ref 97–108)
GFR calc Af Amer: 85 mL/min/{1.73_m2} (ref >59)
Glucose: 102 mg/dL — ABNORMAL HIGH (ref 65–99)
Potassium: 4.1 mmol/L (ref 3.5–5.2)
Total Protein: 6.5 g/dL (ref 6.0–8.5)

## 2013-02-26 LAB — HEMOGLOBIN A1C
Est. average glucose Bld gHb Est-mCnc: 128 mg/dL
Hgb A1c MFr Bld: 6.1 % — ABNORMAL HIGH (ref 4.8–5.6)

## 2013-02-26 LAB — LIPID PANEL
Chol/HDL Ratio: 6.8 ratio units — ABNORMAL HIGH (ref 0.0–4.4)
HDL: 34 mg/dL — ABNORMAL LOW (ref >39)
VLDL Cholesterol Cal: 49 mg/dL — ABNORMAL HIGH (ref 5–40)

## 2013-02-27 ENCOUNTER — Encounter: Payer: Self-pay | Admitting: Nurse Practitioner

## 2013-02-27 ENCOUNTER — Ambulatory Visit (INDEPENDENT_AMBULATORY_CARE_PROVIDER_SITE_OTHER): Payer: Medicare Other | Admitting: Nurse Practitioner

## 2013-02-27 VITALS — BP 136/78 | HR 100 | Temp 97.7°F | Resp 14 | Ht 63.0 in | Wt 202.2 lb

## 2013-02-27 DIAGNOSIS — M542 Cervicalgia: Secondary | ICD-10-CM

## 2013-02-27 DIAGNOSIS — E785 Hyperlipidemia, unspecified: Secondary | ICD-10-CM

## 2013-02-27 DIAGNOSIS — K219 Gastro-esophageal reflux disease without esophagitis: Secondary | ICD-10-CM

## 2013-02-27 NOTE — Patient Instructions (Addendum)
Follow up in 3 months with PANDEY fasting blood work before visit   Cardiac Diet This diet can help prevent heart disease and stroke. Many factors influence your heart health, including eating and exercise habits. Coronary risk rises a lot with abnormal blood fat (lipid) levels. Cardiac meal planning includes limiting unhealthy fats, increasing healthy fats, and making other small dietary changes. General guidelines are as follows:  Adjust calorie intake to reach and maintain desirable body weight.  Limit total fat intake to less than 30% of total calories. Saturated fat should be less than 7% of calories.  Saturated fats are found in animal products and in some vegetable products. Saturated vegetable fats are found in coconut oil, cocoa butter, palm oil, and palm kernel oil. Read labels carefully to avoid these products as much as possible. Use butter in moderation. Choose tub margarines and oils that have 2 grams of fat or less. Good cooking oils are canola and olive oils.  Practice low-fat cooking techniques. Do not fry food. Instead, broil, bake, boil, steam, grill, roast on a rack, stir-fry, or microwave it. Other fat reducing suggestions include:  Remove the skin from poultry.  Remove all visible fat from meats.  Skim the fat off stews, soups, and gravies before serving them.  Steam vegetables in water or broth instead of sauting them in fat.  Avoid foods with trans fat (or hydrogenated oils), such as commercially fried foods and commercially baked goods. Commercial shortening and deep-frying fats will contain trans fat.  Increase intake of fruits, vegetables, whole grains, and legumes to replace foods high in fat.  Increase consumption of nuts, legumes, and seeds to at least 4 servings weekly. One serving of a legume equals  cup, and 1 serving of nuts or seeds equals  cup.  Choose whole grains more often. Have 3 servings per day (a serving is 1 ounce [oz]).  Eat 4 to 5  servings of vegetables per day. A serving of vegetables is 1 cup of raw leafy vegetables;  cup of raw or cooked cut-up vegetables;  cup of vegetable juice.  Eat 4 to 5 servings of fruit per day. A serving of fruit is 1 medium whole fruit;  cup of dried fruit;  cup of fresh, frozen, or canned fruit;  cup of 100% fruit juice.  Increase your intake of dietary fiber to 20 to 30 grams per day. Insoluble fiber may help lower your risk of heart disease and may help curb your appetite.  Soluble fiber binds cholesterol to be removed from the blood. Foods high in soluble fiber are dried beans, citrus fruits, oats, apples, bananas, broccoli, Brussels sprouts, and eggplant.  Try to include foods fortified with plant sterols or stanols, such as yogurt, breads, juices, or margarines. Choose several fortified foods to achieve a daily intake of 2 to 3 grams of plant sterols or stanols.  Foods with omega-3 fats can help reduce your risk of heart disease. Aim to have a 3.5 oz portion of fatty fish twice per week, such as salmon, mackerel, albacore tuna, sardines, lake trout, or herring. If you wish to take a fish oil supplement, choose one that contains 1 gram of both DHA and EPA.  Limit processed meats to 2 servings (3 oz portion) weekly.  Limit the sodium in your diet to 1500 milligrams (mg) per day. If you have high blood pressure, talk to a registered dietitian about a DASH (Dietary Approaches to Stop Hypertension) eating plan.  Limit sweets and beverages with  added sugar, such as soda, to no more than 5 servings per week. One serving is:   1 tablespoon sugar.  1 tablespoon jelly or jam.   cup sorbet.  1 cup lemonade.   cup regular soda. CHOOSING FOODS Starches  Allowed: Breads: All kinds (wheat, rye, raisin, white, oatmeal, [place], [place], and English muffin bread). Low-fat rolls: English muffins, frankfurter and hamburger buns, bagels, pita bread, tortillas (not fried). Pancakes, waffles,  biscuits, and muffins made with recommended oil.  Avoid: Products made with saturated or trans fats, oils, or whole milk products. Butter rolls, cheese breads, croissants. Commercial doughnuts, muffins, sweet rolls, biscuits, waffles, pancakes, store-bought mixes. Crackers  Allowed: Low-fat crackers and snacks: Animal, graham, rye, saltine (with recommended oil, no lard), oyster, and matzo crackers. Bread sticks, melba toast, rusks, flatbread, pretzels, and light popcorn.  Avoid: High-fat crackers: cheese crackers, butter crackers, and those made with coconut, palm oil, or trans fat (hydrogenated oils). Buttered popcorn. Cereals  Allowed: Hot or cold whole-grain cereals.  Avoid: Cereals containing coconut, hydrogenated vegetable fat, or animal fat. Potatoes / Pasta / Rice  Allowed: All kinds of potatoes, rice, and pasta (such as macaroni, spaghetti, and noodles).  Avoid: Pasta or rice prepared with cream sauce or high-fat cheese. Chow mein noodles, [place] fries. Vegetables  Allowed: All vegetables and vegetable juices.  Avoid: Fried vegetables. Vegetables in cream, butter, or high-fat cheese sauces. Limit coconut. Fruit in cream or custard. Protein  Allowed: Limit your intake of meat, seafood, and poultry to no more than 6 oz (cooked weight) per day. All lean, well-trimmed beef, veal, pork, and lamb. All chicken and [place] without skin. All fish and shellfish. Wild game: wild duck, rabbit, pheasant, and venison. Egg whites or low-cholesterol egg substitutes may be used as desired. Meatless dishes: recipes with dried beans, peas, lentils, and tofu (soybean curd). Seeds and nuts: all seeds and most nuts.  Avoid: Prime grade and other heavily marbled and fatty meats, such as short ribs, spare ribs, rib eye roast or steak, frankfurters, sausage, bacon, and high-fat luncheon meats, mutton. Caviar. Commercially fried fish. Domestic duck, goose, venison sausage. Organ meats: liver, gizzard,  heart, chitterlings, brains, kidney, sweetbreads. Dairy  Allowed: Low-fat cheeses: nonfat or low-fat cottage cheese (1% or 2% fat), cheeses made with part skim milk, such as mozzarella, farmers, string, or ricotta. (Cheeses should be labeled no more than 2 to 6 grams fat per oz.). Skim (or 1%) milk: liquid, powdered, or evaporated. Buttermilk made with low-fat milk. Drinks made with skim or low-fat milk or cocoa. Chocolate milk or cocoa made with skim or low-fat (1%) milk. Nonfat or low-fat yogurt.  Avoid: Whole milk cheeses, including colby, cheddar, [place], Monterey Jack, [place], [place], [place], American, Swiss, and blue. Creamed cottage cheese, cream cheese. Whole milk and whole milk products, including buttermilk or yogurt made from whole milk, drinks made from whole milk. Condensed milk, evaporated whole milk, and 2% milk. Soups and Combination Foods  Allowed: Low-fat low-sodium soups: broth, dehydrated soups, homemade broth, soups with the fat removed, homemade cream soups made with skim or low-fat milk. Low-fat spaghetti, lasagna, chili, and Spanish rice if low-fat ingredients and low-fat cooking techniques are used.  Avoid: Cream soups made with whole milk, cream, or high-fat cheese. All other soups. Desserts and Sweets  Allowed: Sherbet, fruit ices, gelatins, meringues, and angel food cake. Homemade desserts with recommended fats, oils, and milk products. Jam, jelly, honey, marmalade, sugars, and syrups. Pure sugar candy, such as gum drops, hard candy,  jelly beans, marshmallows, mints, and small amounts of dark chocolate.  Avoid: Commercially prepared cakes, pies, cookies, frosting, pudding, or mixes for these products. Desserts containing whole milk products, chocolate, coconut, lard, palm oil, or palm kernel oil. Ice cream or ice cream drinks. Candy that contains chocolate, coconut, butter, hydrogenated fat, or unknown ingredients. Buttered syrups. Fats and Oils  Allowed: Vegetable  oils: safflower, sunflower, corn, soybean, cottonseed, sesame, canola, olive, or peanut. Non-hydrogenated margarines. Salad dressing or mayonnaise: homemade or commercial, made with a recommended oil. Low or nonfat salad dressing or mayonnaise.  Limit added fats and oils to 6 to 8 tsp per day (includes fats used in cooking, baking, salads, and spreads on bread). Remember to count the "hidden fats" in foods.  Avoid: Solid fats and shortenings: butter, lard, salt pork, bacon drippings. Gravy containing meat fat, shortening, or suet. Cocoa butter, coconut. Coconut oil, palm oil, palm kernel oil, or hydrogenated oils: these ingredients are often used in bakery products, nondairy creamers, whipped toppings, candy, and commercially fried foods. Read labels carefully. Salad dressings made of unknown oils, sour cream, or cheese, such as blue cheese and Ro[place]ef[place]. Cream, all kinds: half-and-half, light, heavy, or whipping. Sour cream or cream cheese (even if "light" or low-fat). Nondairy cream substitutes: coffee creamers and sour cream substitutes made with palm, palm kernel, hydrogenated oils, or coconut oil. Beverages  Allowed: Coffee (regular or decaffeinated), tea. Diet carbonated beverages, mineral water. Alcohol: Check with your caregiver. Moderation is recommended.  Avoid: Whole milk, regular sodas, and juice drinks with added sugar. Condiments  Allowed: All seasonings and condiments. Cocoa powder. "Cream" sauces made with recommended ingredients.  Avoid: Carob powder made with hydrogenated fats. SAMPLE MENU Breakfast   cup orange juice   cup oatmeal  1 slice toast  1 tsp margarine  1 cup skim milk Lunch  [place] sandwich with 2 oz [place], 2 slices bread  Lettuce and tomato slices  Fresh fruit  Carrot sticks  Coffee or tea Snack  Fresh fruit or low-fat crackers Dinner  3 oz lean ground beef  1 baked potato  1 tsp margarine   cup asparagus  Lettuce salad  1 tbs  non-creamy dressing   cup peach slices  1 cup skim milk Document Released: 04/12/2008 Document Revised: 01/03/2012 Document Reviewed: 09/27/2011 ExitCare Patient Information 2014 [place], [place].

## 2013-02-27 NOTE — Progress Notes (Signed)
Patient ID: Adriana Franklin, female   DOB: [birth year], 72 y.o.   MRN: [id number]   Allergies  Allergen Reactions  . Onion     Chief Complaint  Patient presents with  . Medical Managment of Chronic Issues    HPI: Patient is a 72 y.o. female seen in the office today for follow up on chronic conditions.   Did not start taking medication and tried to make diet modification and increase activity.   Went to GI doctor and reports she has fear of having an procedures being done.  Left knee and shoulder still causing her issues with pain occasionally-- went to PT which had been helping however once she had to pay a co-pay she stopped going because she did not wish to pay for this. Using heat and tylenol as needed for this which helps   Does not have any concerns Review of Systems:  Review of Systems  Constitutional: Negative for fever, chills and malaise/fatigue.  Respiratory: Negative for cough and shortness of breath.   Cardiovascular: Negative for chest pain and palpitations.  Gastrointestinal: Positive for heartburn. Negative for diarrhea and constipation.  Genitourinary: Negative for dysuria, urgency and frequency.  Musculoskeletal: Positive for joint pain (should neck and knee pain).  Skin: Negative.   Neurological: Negative for dizziness and weakness.  Psychiatric/Behavioral: Negative for depression. The patient is nervous/anxious. The patient does not have insomnia.      Past Medical History  Diagnosis Date  . Hashimoto's disease   . Hyperlipidemia   . Fatigue   . Hernia   . Muscle pain   . Arthralgia   . Vertigo   . Anemia   . Allergy     itching  . Arthritis   . Obesity   . Sleep apnea   . Depression   . Muscle cramps   . Raynaud's syndrome   . Hearing loss   . Shortness of breath    Past Surgical History  Procedure Laterality Date  . Spine surgery  1946  . Appendectomy    . Tonsillectomy    . Knee arthroscopy Left   . Throat surgery  1992    for sleep  apnea treatment   Social History:   reports that she has quit smoking. Her smoking use included Cigarettes. She has a 20 pack-year smoking history. She does not have any smokeless tobacco history on file. She reports that she does not drink alcohol or use illicit drugs.  Family History  Problem Relation Age of Onset  . Hypertension Mother   . Heart disease Father     Medications: Patient's Medications  New Prescriptions   No medications on file  Previous Medications   ASPIRIN 81 MG TABLET    Take 81 mg by mouth daily.   LEVOTHYROXINE (SYNTHROID, LEVOTHROID) 112 MCG TABLET    Take 112 mcg by mouth every other day.  Modified Medications   No medications on file  Discontinued Medications   No medications on file     Physical Exam:  Filed Vitals:   02/27/13 1424  BP: 136/78  Pulse: 100  Temp: 97.7 F (36.5 C)  TempSrc: Oral  Resp: 14  Height: 5\' 3"  (1.6 m)  Weight: 202 lb 3.2 oz (91.717 kg)   Physical Exam  Vitals reviewed. Constitutional: She is oriented to person, place, and time and well-developed, well-nourished, and in no distress. No distress.  HENT:  Head: Normocephalic and atraumatic.  Neck: Normal range of motion. Neck supple. No thyromegaly present.  Cardiovascular: Normal rate, regular rhythm and normal heart sounds.   Pulmonary/Chest: Effort normal and breath sounds normal. No respiratory distress.  Abdominal: Soft. Bowel sounds are normal. She exhibits no distension.  Musculoskeletal: She exhibits no edema and no tenderness.  Neurological: She is alert and oriented to person, place, and time.  Skin: Skin is warm and dry. She is not diaphoretic. No erythema.  Psychiatric: Affect normal.     Labs reviewed: Basic Metabolic Panel:  Recent Labs  47/82/95 0807 02/25/13 0846  NA 139 143  K 4.2 4.1  CL 101 101  CO2 25 24  GLUCOSE 110* 102*  BUN 14 10  CREATININE 0.84 0.81  CALCIUM 9.2 9.0  TSH 4.160  --    Liver Function Tests:  Recent Labs   11/27/12 0807 02/25/13 0846  AST 23 20  ALT 17 20  ALKPHOS 83 81  BILITOT 0.7 0.4  PROT 6.9 6.5   No results found for this basename: LIPASE, AMYLASE,  in the last 8760 hours No results found for this basename: AMMONIA,  in the last 8760 hours CBC:  Recent Labs  11/27/12 0807  WBC 3.8  NEUTROABS 1.7  HGB 12.2  HCT 37.4  MCV 75*   Lipid Panel:  Recent Labs  11/27/12 0807 02/25/13 0846  HDL 37* 34*  LDLCALC 190* 621*  TRIG 243* 244*  CHOLHDL 7.5* 6.8*     Assessment/Plan 1. Other and unspecified hyperlipidemia Pt will cont diet modifications and exercise- would like to do this for 3 more months and if her cholesterol has not improved more at that time will start medication  - Lipid panel; Future  2. GERD (gastroesophageal reflux disease) Stable; has recommendations from GI however is unsure if she will have the upper or lower Gi series   3. Neck pain Stable; conts with heat and as needed tylenol   4. Morbid obesity Encouraged to cont diet modification and exercise at least 30 mins 5 days a week    Will follow up in 3 months with lipid panel before visit

## 2013-05-20 ENCOUNTER — Other Ambulatory Visit: Payer: Commercial Managed Care - HMO

## 2013-05-20 DIAGNOSIS — E785 Hyperlipidemia, unspecified: Secondary | ICD-10-CM

## 2013-05-21 LAB — LIPID PANEL
Chol/HDL Ratio: 6.9 ratio units — ABNORMAL HIGH (ref 0.0–4.4)
Cholesterol, Total: 249 mg/dL — ABNORMAL HIGH (ref 100–199)
LDL Calculated: 177 mg/dL — ABNORMAL HIGH (ref 0–99)
VLDL Cholesterol Cal: 36 mg/dL (ref 5–40)

## 2013-05-22 ENCOUNTER — Ambulatory Visit: Payer: Medicare Other | Admitting: Internal Medicine

## 2013-05-23 ENCOUNTER — Other Ambulatory Visit: Payer: Self-pay

## 2013-05-23 ENCOUNTER — Encounter: Payer: Self-pay | Admitting: Nurse Practitioner

## 2013-05-28 ENCOUNTER — Ambulatory Visit: Payer: Self-pay | Admitting: Internal Medicine

## 2013-06-25 ENCOUNTER — Ambulatory Visit (INDEPENDENT_AMBULATORY_CARE_PROVIDER_SITE_OTHER): Payer: Medicare Other | Admitting: Internal Medicine

## 2013-06-25 ENCOUNTER — Encounter: Payer: Self-pay | Admitting: Internal Medicine

## 2013-06-25 VITALS — BP 134/80 | HR 92 | Temp 98.3°F | Wt 202.0 lb

## 2013-06-25 DIAGNOSIS — E785 Hyperlipidemia, unspecified: Secondary | ICD-10-CM

## 2013-06-25 DIAGNOSIS — D509 Iron deficiency anemia, unspecified: Secondary | ICD-10-CM

## 2013-06-25 DIAGNOSIS — K219 Gastro-esophageal reflux disease without esophagitis: Secondary | ICD-10-CM

## 2013-06-25 DIAGNOSIS — E063 Autoimmune thyroiditis: Secondary | ICD-10-CM

## 2013-06-25 NOTE — Progress Notes (Signed)
Patient ID: Adriana Franklin, female   DOB: [birth year], 72 y.o.   MRN: [id number]     Chief Complaint  Patient presents with  . Medical Managment of Chronic Issues    3 month follow-up    Allergies  Allergen Reactions  . Onion     HPI 72 y/o female pt is here for routine follow up She sees Dr Altimer from endocrine and is on 112 mcg f/b 125 mcg every other day for her hypothyroidism. Symptoms are under control She exercise and goes to [facility] and works out for 30 min - does full body workout She tries to be careful about what she eats Reviewed her labs with her elevated cholesterol level She mentions having iron deficinecy anemia in past but not being on any medication and would like her iron level checked on next blood draw  Review of Systems:  Constitutional: Negative for fever, chills and malaise/fatigue.  Respiratory: Negative for cough and shortness of breath.   Cardiovascular: Negative for chest pain and palpitations.  Gastrointestinal: Positive for heartburn. Negative for diarrhea and constipation.  Genitourinary: Negative for dysuria, urgency and frequency.  Musculoskeletal: negative for chest pain Skin: Negative.   Neurological: Negative for dizziness and weakness.  Psychiatric/Behavioral: Negative for depression. The patient is nervous/anxious. The patient does not have insomnia.     Past Medical History  Diagnosis Date  . Hashimoto's disease   . Hyperlipidemia   . Fatigue   . Hernia   . Muscle pain   . Arthralgia   . Vertigo   . Anemia   . Allergy     itching  . Arthritis   . Obesity   . Sleep apnea   . Depression   . Muscle cramps   . Raynaud's syndrome   . Hearing loss   . Shortness of breath    Past Surgical History  Procedure Laterality Date  . Spine surgery  1946  . Appendectomy    . Tonsillectomy    . Knee arthroscopy Left   . Throat surgery  1992    for sleep apnea treatment   Current Outpatient Prescriptions on File Prior to Visit    Medication Sig Dispense Refill  . levothyroxine (SYNTHROID, LEVOTHROID) 112 MCG tablet Take 112 mcg by mouth every other day.       No current facility-administered medications on file prior to visit.   Physical Exam  BP 134/80  Pulse 92  Temp(Src) 98.3 F (36.8 C) (Oral)  Wt 202 lb (91.627 kg)  SpO2 95%  Vitals reviewed. Constitutional: She is oriented to person, place, and time and well-developed, well-nourished, and in no distress. No distress. obese HENT:   Head: Normocephalic and atraumatic.  Neck: Normal range of motion. Neck supple. No thyromegaly present.  Cardiovascular: Normal rate, regular rhythm and normal heart sounds.   Pulmonary/Chest: Effort normal and breath sounds normal. No respiratory distress.  Abdominal: Soft. Bowel sounds are normal. She exhibits no distension.  Musculoskeletal: She exhibits no edema and no tenderness.  Neurological: She is alert and oriented to person, place, and time.  Skin: Skin is warm and dry. She is not diaphoretic. No erythema.  Psychiatric: Affect normal.   Labs- Lab Results  Component Value Date   TSH 4.160 11/27/2012    Lipid Panel     Component Value Date/Time   TRIG 181* 05/20/2013 0808   HDL 36* 05/20/2013 0808   CHOLHDL 6.9* 05/20/2013 0808   LDLCALC 177* 05/20/2013 0808   CBC  Component Value Date/Time   WBC 3.8 11/27/2012 0807   RBC 4.99 11/27/2012 0807   HGB 12.2 11/27/2012 0807   HCT 37.4 11/27/2012 0807   MCV 75* 11/27/2012 0807   MCH 24.4* 11/27/2012 0807   MCHC 32.6 11/27/2012 0807   RDW 17.0* 11/27/2012 0807   LYMPHSABS 1.6 11/27/2012 0807   EOSABS 0.1 11/27/2012 0807   BASOSABS 0.0 11/27/2012 0807    CMP     Component Value Date/Time   NA 143 02/25/2013 0846   K 4.1 02/25/2013 0846   CL 101 02/25/2013 0846   CO2 24 02/25/2013 0846   GLUCOSE 102* 02/25/2013 0846   BUN 10 02/25/2013 0846   CREATININE 0.81 02/25/2013 0846   CALCIUM 9.0 02/25/2013 0846   PROT 6.5 02/25/2013 0846   AST 20 02/25/2013 0846   ALT 20  02/25/2013 0846   ALKPHOS 81 02/25/2013 0846   BILITOT 0.4 02/25/2013 0846   GFRNONAA 74 02/25/2013 0846   GFRAA 85 02/25/2013 0846   Assessment/Plan 1. Other and unspecified hyperlipidemia She does not want to start medication at present. She would like to continue her exercise and dietary modification. She understands the risk of high cholesterol level.  - Lipid panel; Future  2. GERD (gastroesophageal reflux disease) Stable. Currently off all medication. Weight loss would be helpful  3. Morbid obesity Encouraged to continue diet modification and exercise at least 30 mins 5 days a week   4.hashimoto's disease Follows with endocrine and continue levothyroxine  5. Iron def anemia Check cbc and ferritin level next visit

## 2013-06-26 ENCOUNTER — Encounter: Payer: Self-pay | Admitting: Nurse Practitioner

## 2013-11-20 ENCOUNTER — Ambulatory Visit (INDEPENDENT_AMBULATORY_CARE_PROVIDER_SITE_OTHER): Payer: Commercial Managed Care - HMO | Admitting: Nurse Practitioner

## 2013-11-20 ENCOUNTER — Encounter: Payer: Self-pay | Admitting: Nurse Practitioner

## 2013-11-20 VITALS — BP 146/80 | HR 81 | Wt 205.0 lb

## 2013-11-20 DIAGNOSIS — R229 Localized swelling, mass and lump, unspecified: Secondary | ICD-10-CM

## 2013-11-20 DIAGNOSIS — IMO0001 Reserved for inherently not codable concepts without codable children: Secondary | ICD-10-CM

## 2013-11-20 DIAGNOSIS — F411 Generalized anxiety disorder: Secondary | ICD-10-CM

## 2013-11-20 NOTE — Progress Notes (Signed)
Patient ID: Adriana Franklin, female   DOB: [birth year], 73 y.o.   MRN: [id number]    Allergies  Allergen Reactions  . Onion     Chief Complaint  Patient presents with  . Hand Problem    Examine left hand (middle finger), patient with raised vien that is concerning     HPI: Patient is a 73 y.o. female seen in the office today for knot on her 3rd finger where a vein is. Tender. Has been going on for 2 weeks. Does not remember an injury. Able to move finger without difficulty  whats records sent from cardiology- had extensive work up in the past from Dr Wynonia Lawman- no new issues  Feels like she can not focus- thinks she has adult onset ADD, would like to go to psychiatry for testing. Also reports anxiety but does not wish to start anything at this time.   Review of Systems:  Review of Systems  Constitutional: Negative for fever and chills.  Respiratory: Negative for cough and shortness of breath.   Cardiovascular: Negative for chest pain.  Gastrointestinal: Positive for heartburn. Negative for abdominal pain, diarrhea, constipation and blood in stool.  Genitourinary: Negative for dysuria.  Musculoskeletal: Negative for myalgias.  Skin: Negative for itching and rash.  Psychiatric/Behavioral: Negative for depression. The patient is nervous/anxious.      Past Medical History  Diagnosis Date  . Hashimoto's disease   . Hyperlipidemia   . Fatigue   . Hernia   . Muscle pain   . Arthralgia   . Vertigo   . Anemia   . Allergy     itching  . Arthritis   . Obesity   . Sleep apnea   . Depression   . Muscle cramps   . Raynaud's syndrome   . Hearing loss   . Shortness of breath    Past Surgical History  Procedure Laterality Date  . Spine surgery  1946  . Appendectomy    . Tonsillectomy    . Knee arthroscopy Left   . Throat surgery  1992    for sleep apnea treatment   Social History:   reports that she has quit smoking. Her smoking use included Cigarettes. She has a 20  pack-year smoking history. She does not have any smokeless tobacco history on file. She reports that she does not drink alcohol or use illicit drugs.  Family History  Problem Relation Age of Onset  . Hypertension Mother   . Heart disease Father   . Learning disabilities Sister     Medications: Patient's Medications  New Prescriptions   No medications on file  Previous Medications   AMBULATORY NON FORMULARY MEDICATION    Medication Name: Ultimate H.A supplement: 2 by mouth daily   AMBULATORY NON FORMULARY MEDICATION    Medication Name: AstaFX- 2 by mouth daily   HYDROCHLOROTHIAZIDE (HYDRODIURIL) 25 MG TABLET    Take 25 mg by mouth as needed (For Swelling).   LEVOTHYROXINE (SYNTHROID, LEVOTHROID) 112 MCG TABLET    Take 112 mcg by mouth every other day.   LEVOTHYROXINE (SYNTHROID, LEVOTHROID) 125 MCG TABLET    Take 125 mcg by mouth. Take 1 by mouth every other day (alternating with 112 mcg)  Modified Medications   No medications on file  Discontinued Medications   No medications on file     Physical Exam:  Filed Vitals:   11/20/13 0857  BP: 146/80  Pulse: 81  Weight: 205 lb (92.987 kg)  SpO2: 98%  Physical Exam  Vitals reviewed. Constitutional: She is oriented to person, place, and time and well-developed, well-nourished, and in no distress.  HENT:  Head: Normocephalic and atraumatic.  Cardiovascular: Normal rate, regular rhythm and normal heart sounds.   Pulmonary/Chest: Effort normal and breath sounds normal. No respiratory distress.  Abdominal: Soft. Bowel sounds are normal. She exhibits no distension.  Musculoskeletal: Normal range of motion. She exhibits no edema and no tenderness.  Neurological: She is alert and oriented to person, place, and time.  Skin: Skin is warm and dry. She is not diaphoretic. No erythema.  Raised area on 3rd finger distal to PIP joint- no erythema, edema, or ecchymosis   Psychiatric: Affect normal.     Labs reviewed: Basic Metabolic  Panel:  Recent Labs  11/27/12 0807 02/25/13 0846  NA 139 143  K 4.2 4.1  CL 101 101  CO2 25 24  GLUCOSE 110* 102*  BUN 14 10  CREATININE 0.84 0.81  CALCIUM 9.2 9.0  TSH 4.160  --    Liver Function Tests:  Recent Labs  11/27/12 0807 02/25/13 0846  AST 23 20  ALT 17 20  ALKPHOS 83 81  BILITOT 0.7 0.4  PROT 6.9 6.5   No results found for this basename: LIPASE, AMYLASE,  in the last 8760 hours No results found for this basename: AMMONIA,  in the last 8760 hours CBC:  Recent Labs  11/27/12 0807  WBC 3.8  NEUTROABS 1.7  HGB 12.2  HCT 37.4  MCV 75*   Lipid Panel:  Recent Labs  11/27/12 0807 02/25/13 0846 05/20/13 0808  HDL 37* 34* 36*  LDLCALC 190* 147* 177*  TRIG 243* 244* 181*  CHOLHDL 7.5* 6.8* 6.9*   TSH:  Recent Labs  11/27/12 0807  TSH 4.160   A1C: Lab Results  Component Value Date   HGBA1C 6.1* 02/25/2013     Assessment/Plan 1. Anxiety state, unspecified -with possible Adult ADD, does not want to start medication at this time but will go to psychiatry for ADD testing and follow up with [place] afterwards -will provide referral if needed by insurance  2. Bump Appears self limiting, reports not painful enough for medications, may use ice as needed  To notify if becomes larger with heat, drainage, or swelling of the finger  To keep  Routine follow up next month with blood work before

## 2013-12-25 ENCOUNTER — Other Ambulatory Visit: Payer: Self-pay | Admitting: *Deleted

## 2013-12-25 DIAGNOSIS — D509 Iron deficiency anemia, unspecified: Secondary | ICD-10-CM

## 2013-12-25 DIAGNOSIS — E063 Autoimmune thyroiditis: Secondary | ICD-10-CM

## 2013-12-25 DIAGNOSIS — E785 Hyperlipidemia, unspecified: Secondary | ICD-10-CM

## 2013-12-26 ENCOUNTER — Other Ambulatory Visit: Payer: Commercial Managed Care - HMO

## 2013-12-26 DIAGNOSIS — D509 Iron deficiency anemia, unspecified: Secondary | ICD-10-CM

## 2013-12-26 DIAGNOSIS — E063 Autoimmune thyroiditis: Secondary | ICD-10-CM

## 2013-12-26 DIAGNOSIS — E785 Hyperlipidemia, unspecified: Secondary | ICD-10-CM

## 2013-12-27 ENCOUNTER — Telehealth: Payer: Self-pay | Admitting: *Deleted

## 2013-12-27 LAB — COMPREHENSIVE METABOLIC PANEL
A/G RATIO: 1.6 (ref 1.1–2.5)
ALBUMIN: 4.3 g/dL (ref 3.5–4.8)
ALK PHOS: 80 IU/L (ref 39–117)
ALT: 19 IU/L (ref 0–32)
AST: 20 IU/L (ref 0–40)
BUN / CREAT RATIO: 20 (ref 11–26)
BUN: 16 mg/dL (ref 8–27)
CO2: 22 mmol/L (ref 18–29)
CREATININE: 0.82 mg/dL (ref 0.57–1.00)
Calcium: 8.9 mg/dL (ref 8.7–10.3)
Chloride: 99 mmol/L (ref 97–108)
GFR calc Af Amer: 83 mL/min/{1.73_m2} (ref >59)
GFR calc non Af Amer: 72 mL/min/{1.73_m2} (ref >59)
Globulin, Total: 2.7 g/dL (ref 1.5–4.5)
Glucose: 105 mg/dL — ABNORMAL HIGH (ref 65–99)
Potassium: 4.6 mmol/L (ref 3.5–5.2)
Sodium: 138 mmol/L (ref 134–144)
Total Bilirubin: 0.4 mg/dL (ref 0.0–1.2)
Total Protein: 7 g/dL (ref 6.0–8.5)

## 2013-12-27 LAB — CBC WITH DIFFERENTIAL/PLATELET
BASOS ABS: 0 10*3/uL (ref 0.0–0.2)
Basos: 1 %
EOS: 5 %
Eosinophils Absolute: 0.2 10*3/uL (ref 0.0–0.4)
HCT: 36.2 % (ref 34.0–46.6)
Hemoglobin: 11 g/dL — ABNORMAL LOW (ref 11.1–15.9)
IMMATURE GRANULOCYTES: 0 %
Immature Grans (Abs): 0 10*3/uL (ref 0.0–0.1)
LYMPHS: 39 %
Lymphocytes Absolute: 1.4 10*3/uL (ref 0.7–3.1)
MCH: 22.4 pg — ABNORMAL LOW (ref 26.6–33.0)
MCHC: 30.4 g/dL — AB (ref 31.5–35.7)
MCV: 74 fL — ABNORMAL LOW (ref 79–97)
MONOCYTES: 7 %
Monocytes Absolute: 0.3 10*3/uL (ref 0.1–0.9)
NEUTROS ABS: 1.7 10*3/uL (ref 1.4–7.0)
Neutrophils Relative %: 48 %
RBC: 4.91 x10E6/uL (ref 3.77–5.28)
RDW: 19.4 % — ABNORMAL HIGH (ref 12.3–15.4)
WBC: 3.6 10*3/uL (ref 3.4–10.8)

## 2013-12-27 LAB — LIPID PANEL
CHOL/HDL RATIO: 6.7 ratio — AB (ref 0.0–4.4)
Cholesterol, Total: 255 mg/dL — ABNORMAL HIGH (ref 100–199)
HDL: 38 mg/dL — ABNORMAL LOW (ref >39)
LDL CALC: 168 mg/dL — AB (ref 0–99)
TRIGLYCERIDES: 243 mg/dL — AB (ref 0–149)
VLDL Cholesterol Cal: 49 mg/dL — ABNORMAL HIGH (ref 5–40)

## 2013-12-27 LAB — FERRITIN: FERRITIN: 7 ng/mL — AB (ref 15–150)

## 2013-12-27 NOTE — Telephone Encounter (Signed)
Called and left message for patient to return call to office for results.

## 2013-12-27 NOTE — Telephone Encounter (Signed)
Message copied by Eilene Ghazi on Fri Dec 27, 2013  4:27 PM ------      Message from: Blanchie Serve      Created: Fri Dec 27, 2013  8:38 AM       Extremely low iron store. To be started on ferrous sulfate 325 mg bid for now and recheck ferritin and cbc in 3 months. ------

## 2013-12-30 ENCOUNTER — Other Ambulatory Visit: Payer: Medicare Other

## 2014-01-01 ENCOUNTER — Encounter: Payer: Self-pay | Admitting: Nurse Practitioner

## 2014-01-01 ENCOUNTER — Ambulatory Visit (INDEPENDENT_AMBULATORY_CARE_PROVIDER_SITE_OTHER): Payer: Commercial Managed Care - HMO | Admitting: Nurse Practitioner

## 2014-01-01 VITALS — BP 120/74 | HR 88 | Temp 97.5°F | Resp 20 | Ht 63.0 in | Wt 206.2 lb

## 2014-01-01 DIAGNOSIS — D509 Iron deficiency anemia, unspecified: Secondary | ICD-10-CM

## 2014-01-01 DIAGNOSIS — IMO0001 Reserved for inherently not codable concepts without codable children: Secondary | ICD-10-CM

## 2014-01-01 DIAGNOSIS — K219 Gastro-esophageal reflux disease without esophagitis: Secondary | ICD-10-CM

## 2014-01-01 DIAGNOSIS — E063 Autoimmune thyroiditis: Secondary | ICD-10-CM

## 2014-01-01 DIAGNOSIS — F411 Generalized anxiety disorder: Secondary | ICD-10-CM

## 2014-01-01 DIAGNOSIS — E785 Hyperlipidemia, unspecified: Secondary | ICD-10-CM

## 2014-01-01 DIAGNOSIS — R229 Localized swelling, mass and lump, unspecified: Secondary | ICD-10-CM

## 2014-01-01 NOTE — Progress Notes (Signed)
Patient ID: Adriana Franklin, female   DOB: [birth year], 73 y.o.   MRN: [id number] .    Allergies  Allergen Reactions  . Onion     Chief Complaint  Patient presents with  . Follow-up    HPI: Patient is a 73 y.o. female seen in the office today for routine follow-up. Doing well, bump on finger has resolved. Has not made appt for psyciatrist. Did on-line survey which diagnosised her with ADD. Was diagnosed  with iron def anemia 6 months ago, bought bottle of iron but has not been taking it regularly Would like to consider taking cholesterol medication but does not want to start until after iron has improved. Otherwise doing well without concerns however does notice increases in shortness of breath with activity  Review of Systems:  Review of Systems  Constitutional: Negative for fever and chills.  Respiratory: Positive for shortness of breath (on exertion). Negative for cough.   Cardiovascular: Negative for chest pain and leg swelling.  Gastrointestinal: Negative for heartburn, abdominal pain, diarrhea, constipation and blood in stool.  Genitourinary: Negative for dysuria.  Musculoskeletal: Negative for myalgias.  Skin: Negative for itching and rash.  Psychiatric/Behavioral: Negative for depression. The patient is nervous/anxious.      Past Medical History  Diagnosis Date  . Hashimoto's disease   . Hyperlipidemia   . Fatigue   . Hernia   . Muscle pain   . Arthralgia   . Vertigo   . Anemia   . Allergy     itching  . Arthritis   . Obesity   . Sleep apnea   . Depression   . Muscle cramps   . Raynaud's syndrome   . Hearing loss   . Shortness of breath    Past Surgical History  Procedure Laterality Date  . Spine surgery  1946  . Appendectomy    . Tonsillectomy    . Knee arthroscopy Left   . Throat surgery  1992    for sleep apnea treatment   Social History:   reports that she has quit smoking. Her smoking use included Cigarettes. She has a 20 pack-year smoking  history. She does not have any smokeless tobacco history on file. She reports that she does not drink alcohol or use illicit drugs.  Family History  Problem Relation Age of Onset  . Hypertension Mother   . Heart disease Father   . Learning disabilities Sister     Medications: Patient's Medications  New Prescriptions   No medications on file  Previous Medications   AMBULATORY NON FORMULARY MEDICATION    Medication Name: Ultimate H.A supplement: 2 by mouth daily   AMBULATORY NON FORMULARY MEDICATION    Medication Name: AstaFX- 2 by mouth daily   HYDROCHLOROTHIAZIDE (HYDRODIURIL) 25 MG TABLET    Take 25 mg by mouth as needed (For Swelling).   LEVOTHYROXINE (SYNTHROID, LEVOTHROID) 112 MCG TABLET    Take 112 mcg by mouth every other day.   LEVOTHYROXINE (SYNTHROID, LEVOTHROID) 125 MCG TABLET    Take 125 mcg by mouth. Take 1 by mouth every other day (alternating with 112 mcg)  Modified Medications   No medications on file  Discontinued Medications   No medications on file     Physical Exam:  Filed Vitals:   01/01/14 0827  BP: 120/74  Pulse: 88  Temp: 97.5 F (36.4 C)  TempSrc: Oral  Resp: 20  Height: 5\' 3"  (1.6 m)  Weight: 206 lb 3.2 oz (93.532 kg)  SpO2: 96%  Physical Exam  Vitals reviewed. Constitutional: She is oriented to person, place, and time and well-developed, well-nourished, and in no distress.  HENT:  Head: Normocephalic and atraumatic.  Cardiovascular: Normal rate, regular rhythm and normal heart sounds.   Pulmonary/Chest: Effort normal and breath sounds normal. No respiratory distress.  Abdominal: Soft. Bowel sounds are normal. She exhibits no distension.  Musculoskeletal: Normal range of motion. She exhibits no edema and no tenderness.  Neurological: She is alert and oriented to person, place, and time.  Skin: Skin is warm and dry. She is not diaphoretic. No erythema.  Psychiatric: Affect normal.     Labs reviewed: Basic Metabolic Panel:  Recent  Labs  02/25/13 0846 12/26/13 0832  NA 143 138  K 4.1 4.6  CL 101 99  CO2 24 22  GLUCOSE 102* 105*  BUN 10 16  CREATININE 0.81 0.82  CALCIUM 9.0 8.9   Liver Function Tests:  Recent Labs  02/25/13 0846 12/26/13 0832  AST 20 20  ALT 20 19  ALKPHOS 81 80  BILITOT 0.4 0.4  PROT 6.5 7.0   No results found for this basename: LIPASE, AMYLASE,  in the last 8760 hours No results found for this basename: AMMONIA,  in the last 8760 hours CBC:  Recent Labs  12/26/13 0832  WBC 3.6  NEUTROABS 1.7  HGB 11.0*  HCT 36.2  MCV 74*   Lipid Panel:  Recent Labs  02/25/13 0846 05/20/13 0808 12/26/13 0832  HDL 34* 36* 38*  LDLCALC 147* 177* 168*  TRIG 244* 181* 243*  CHOLHDL 6.8* 6.9* 6.7*  A1C: Lab Results  Component Value Date   HGBA1C 6.1* 02/25/2013    Lab Results  Component Value Date   TSH 4.160 11/27/2012    Assessment/Plan 1. GERD (gastroesophageal reflux disease) -controlled on lifestyle modifications  2. Hashimoto's disease Will need follow up TSH before next visit, conts on synthroid alternating doses   3. Anemia, iron deficiency -plans to take iron routinely, educated on need to take it scheduled  - Ambulatory referral to Gastroenterology - CBC With differential/Platelet; Future - Ferritin; Future  4. Anxiety state, unspecified   -plans to follow up with psychiatry regarding anxiety and possible ADD  5. Bump  -resolved   6. Hyperlipidemia -now willing to start medication but does not want to do so until after she has been on routine iron  -will start lipid lower agent at next visit

## 2014-01-01 NOTE — Patient Instructions (Signed)
Follow up in 3 months with blood work before to test iron Cont ferrous sulfate (iron) 325 mg 2-3 times daily with meals Iron may cause constipation Will put in consult for GI for screening test Constipation Constipation is when a person has fewer than three bowel movements a week, has difficulty having a bowel movement, or has stools that are dry, hard, or larger than normal. As people grow older, constipation is more common. If you try to fix constipation with medicines that make you have a bowel movement (laxatives), the problem may get worse. Long-term laxative use may cause the muscles of the colon to become weak. A low-fiber diet, not taking in enough fluids, and taking certain medicines may make constipation worse.  CAUSES   Certain medicines, such as antidepressants, pain medicine, iron supplements, antacids, and water pills.   Certain diseases, such as diabetes, irritable bowel syndrome (IBS), thyroid disease, or depression.   Not drinking enough water.   Not eating enough fiber-rich foods.   Stress or travel.   Lack of physical activity or exercise.   Ignoring the urge to have a bowel movement.   Using laxatives too much.  SIGNS AND SYMPTOMS   Having fewer than three bowel movements a week.   Straining to have a bowel movement.   Having stools that are hard, dry, or larger than normal.   Feeling full or bloated.   Pain in the lower abdomen.   Not feeling relief after having a bowel movement.  DIAGNOSIS  Your health care provider will take a medical history and perform a physical exam. Further testing may be done for severe constipation. Some tests may include:  A barium enema X-ray to examine your rectum, colon, and, sometimes, your small intestine.   A sigmoidoscopy to examine your lower colon.   A colonoscopy to examine your entire colon. TREATMENT  Treatment will depend on the severity of your constipation and what is causing it. Some dietary  treatments include drinking more fluids and eating more fiber-rich foods. Lifestyle treatments may include regular exercise. If these diet and lifestyle recommendations do not help, your health care provider may recommend taking over-the-counter laxative medicines to help you have bowel movements. Prescription medicines may be prescribed if over-the-counter medicines do not work.  HOME CARE INSTRUCTIONS   Eat foods that have a lot of fiber, such as fruits, vegetables, whole grains, and beans.  Limit foods high in fat and processed sugars, such as french fries, hamburgers, cookies, candies, and soda.   A fiber supplement may be added to your diet if you cannot get enough fiber from foods.   Drink enough fluids to keep your urine clear or pale yellow.   Exercise regularly or as directed by your health care provider.   Go to the restroom when you have the urge to go. Do not hold it.   Only take over-the-counter or prescription medicines as directed by your health care provider. Do not take other medicines for constipation without talking to your health care provider first.  [place] IF:   You have bright red blood in your stool.   Your constipation lasts for more than 4 days or gets worse.   You have abdominal or rectal pain.   You have thin, pencil-like stools.   You have unexplained weight loss. MAKE SURE YOU:   Understand these instructions.  Will watch your condition.  Will get help right away if you are not doing well or get worse. Document  Released: 04/01/2004 Document Revised: 07/09/2013 Document Reviewed: 04/15/2013 [facility] Patient Information 2015 [place], [place]. This information is not intended to replace advice given to you by your health care provider. Make sure you discuss any questions you have with your health care provider.

## 2014-01-14 ENCOUNTER — Telehealth: Payer: Self-pay | Admitting: Nurse Practitioner

## 2014-01-14 ENCOUNTER — Other Ambulatory Visit: Payer: Self-pay | Admitting: Nurse Practitioner

## 2014-01-14 DIAGNOSIS — E063 Autoimmune thyroiditis: Secondary | ICD-10-CM

## 2014-01-14 NOTE — Telephone Encounter (Signed)
AMY FROM [facility] (DR. Elyse Hsu) CALLED AND THIS PATIENT IS SCHEDULED TO BE SEEN TOMORROW (01/15/14), SHE IS ALREADY AN ESTABLISHED PATIENT WITH THEM BUT THEY NEED A REFERRAL DUE TO HER INSURANCE. AMY IS REQUESTING WE INITIATE A REFERRAL SO THAT THE PATIENT CAN BEEN SEEN TOMORROW. PLEASE ADVISE. [place]

## 2014-01-14 NOTE — Telephone Encounter (Signed)
done

## 2014-01-16 ENCOUNTER — Encounter: Payer: Self-pay | Admitting: Nurse Practitioner

## 2014-01-20 ENCOUNTER — Encounter: Payer: Self-pay | Admitting: Internal Medicine

## 2014-01-21 ENCOUNTER — Other Ambulatory Visit: Payer: Self-pay | Admitting: Nurse Practitioner

## 2014-01-21 ENCOUNTER — Encounter: Payer: Self-pay | Admitting: Nurse Practitioner

## 2014-01-21 DIAGNOSIS — Z135 Encounter for screening for eye and ear disorders: Secondary | ICD-10-CM

## 2014-01-22 ENCOUNTER — Encounter: Payer: Self-pay | Admitting: Nurse Practitioner

## 2014-01-22 ENCOUNTER — Ambulatory Visit: Payer: Commercial Managed Care - HMO | Admitting: Internal Medicine

## 2014-01-22 ENCOUNTER — Encounter: Payer: Self-pay | Admitting: Internal Medicine

## 2014-01-22 NOTE — Telephone Encounter (Signed)
Noted  

## 2014-02-03 ENCOUNTER — Encounter: Payer: Self-pay | Admitting: Nurse Practitioner

## 2014-02-12 ENCOUNTER — Ambulatory Visit (INDEPENDENT_AMBULATORY_CARE_PROVIDER_SITE_OTHER): Payer: Commercial Managed Care - HMO | Admitting: Internal Medicine

## 2014-02-12 ENCOUNTER — Encounter: Payer: Self-pay | Admitting: Internal Medicine

## 2014-02-12 VITALS — BP 132/78 | HR 89 | Temp 98.4°F | Resp 10 | Wt 204.0 lb

## 2014-02-12 DIAGNOSIS — L03031 Cellulitis of right toe: Secondary | ICD-10-CM

## 2014-02-12 DIAGNOSIS — L02619 Cutaneous abscess of unspecified foot: Secondary | ICD-10-CM

## 2014-02-12 DIAGNOSIS — B351 Tinea unguium: Secondary | ICD-10-CM

## 2014-02-12 DIAGNOSIS — L03039 Cellulitis of unspecified toe: Secondary | ICD-10-CM

## 2014-02-12 MED ORDER — DOXYCYCLINE HYCLATE 100 MG PO TABS: 100.0000 mg | ORAL_TABLET | Freq: Two times a day (BID) | ORAL | Status: DC

## 2014-02-12 NOTE — Progress Notes (Signed)
Patient ID: Adriana Franklin, female   DOB: 1941/04/01, 73 y.o.   MRN: [id number]    Chief Complaint  Patient presents with  . Nail Problem    Infection of right big toe, toe was ozzing Sunday. Toe is painful, pain increases when wearing closed toe shoes    Allergies  Allergen Reactions  . Onion    HPI 73 y/o female pt is here for AV. She was watching television few days back when she pulled on her toe nail and noticed it to be painful. She then started having some bleeding and noticed her toe to be red and swollen. She mentions it to be painful and sore and decided to get it checked today. She is not able to wear closed shoes and tried to trim the toe nail yesterday causing more pain  ROS No fever or chills Denies purulent drainage or active bleeding Never had similar episode before  Past Medical History  Diagnosis Date  . Hashimoto's disease   . Hyperlipidemia   . Fatigue   . Hernia   . Muscle pain   . Arthralgia   . Vertigo   . Anemia   . Allergy     itching  . Arthritis   . Obesity   . Sleep apnea   . Depression   . Muscle cramps   . Raynaud's syndrome   . Hearing loss   . Shortness of breath    Current Outpatient Prescriptions on File Prior to Visit  Medication Sig Dispense Refill  . AMBULATORY NON FORMULARY MEDICATION Medication Name: Ultimate H.A supplement: 2 by mouth daily      . AMBULATORY NON FORMULARY MEDICATION Medication Name: AstaFX- 2 by mouth daily      . hydrochlorothiazide (HYDRODIURIL) 25 MG tablet Take 25 mg by mouth as needed (For Swelling).      Marland Kitchen levothyroxine (SYNTHROID, LEVOTHROID) 125 MCG tablet Take 125 mcg by mouth daily before breakfast.        No current facility-administered medications on file prior to visit.    Physical exam BP 132/78  Pulse 89  Temp(Src) 98.4 F (36.9 C) (Oral)  Resp 10  Wt 204 lb (92.534 kg)  SpO2 96%  General- elderly female in no acute distress, obese Musculoskeletal- able to move all 4 extremities,  normal range of motion, no leg edema Neurological- no focal deficit Skin- warm and dry, redness with tenderness in right big toe area, good dorsalis pedis, has a shoe blister on right medial side of the foot as well, blood mixed with some serous drainage behind the nail bed of right great toe with some ingrown toe nail, nail appears brittle Psychiatry- alert and oriented to person, place and time, normal mood and affect  Assessment/plan  1. Onychomycosis of right great toe Will provide podiatry referral. Pt will likely need removal of toe nail to help assess for infection under the nail bed. Pain under control with current OTC medications. Can continue this  2. Cellulitis of toe, right Treat with doxycycline 100 mg bid for 10 days to help with the infection. Avoid trimming of toe nail and wear open shoes until seen by podiatry - Ambulatory referral to Podiatry

## 2014-02-13 ENCOUNTER — Encounter: Payer: Self-pay | Admitting: Internal Medicine

## 2014-02-13 MED ORDER — CEPHALEXIN 500 MG PO CAPS: 500.0000 mg | ORAL_CAPSULE | Freq: Four times a day (QID) | ORAL | Status: DC

## 2014-02-13 NOTE — Telephone Encounter (Signed)
RX sent to the pharmacy  

## 2014-02-19 ENCOUNTER — Encounter: Payer: Self-pay | Admitting: Podiatry

## 2014-02-19 ENCOUNTER — Ambulatory Visit (INDEPENDENT_AMBULATORY_CARE_PROVIDER_SITE_OTHER): Payer: Commercial Managed Care - HMO | Admitting: Podiatry

## 2014-02-19 VITALS — BP 148/77 | HR 78 | Resp 16 | Ht 61.0 in | Wt 203.0 lb

## 2014-02-19 DIAGNOSIS — L03039 Cellulitis of unspecified toe: Secondary | ICD-10-CM

## 2014-02-19 DIAGNOSIS — IMO0002 Reserved for concepts with insufficient information to code with codable children: Secondary | ICD-10-CM | POA: Insufficient documentation

## 2014-02-19 NOTE — Patient Instructions (Signed)
Monitor for any signs of infection which include increased pain, drainage, redness or no improvement in symptoms. If there are no improvement in symptoms or any changes, call ASAP!   Betadine Soak Instructions  Purchase an 8 oz. bottle of BETADINE solution (Povidone)  THE DAY AFTER THE PROCEDURE  Place 1 tablespoon of betadine solution in a quart of warm tap water.  Submerge your foot or feet with outer bandage intact for the initial soak; this will allow the bandage to become moist and wet for easy lift off.  Once you remove your bandage, continue to soak in the solution for 20 minutes.  This soak should be done twice a day.  Next, remove your foot or feet from solution, blot dry the affected area and cover.  You may use a band aid large enough to cover the area or use gauze and tape.  Apply other medications to the area as directed by the doctor such as cortisporin otic solution (ear drops) or neosporin.  IF YOUR SKIN BECOMES IRRITATED WHILE USING THESE INSTRUCTIONS, IT IS OKAY TO SWITCH TO EPSOM SALTS AND WATER OR WHITE VINEGAR AND WATER.

## 2014-02-19 NOTE — Progress Notes (Signed)
   Subjective:    Patient ID: Adriana Franklin, female    DOB: [birth year], 73 y.o.   MRN: [id number]  HPI Comments: N toenail problem L right 1st toenail D couple years O over 7 days ago, when the discharge was discovered C thickened, discolored loose toenail, hx of discharge from beneath A pressure T H2O2, Tree oil soak  Patient states that approximately 1-1/2 weeks ago she started to notice drainage coming from her right hallux toenail. She was seen by her primary care physician where she was placed on antibiotics. She states that since starting antibiotics the drainage has decreased as well as the discomfort. She denies any systemic complaints such as fevers chills nausea or vomiting.    Review of Systems  All other systems reviewed and are negative.      Objective:   Physical Exam Bilateral pedal pulses palpable. CRT < 3 seconds.  Protective sensation intact. Right hallux nail is loose without any attachment of the nail bed distally. Nail has a yellow discoloration. Upon sharp debridement of the loose nail a small amount of purulence was noted underneath the nail bed. There is no asending cellulitis, erythema. Mild tenderness to palpation over the proximal nail fold.        Assessment & Plan:  73 year old female with paronychia right hallux nail. - It was recommended that a total nail avulsion be performed at this time due to the purulence underneath the nailbed as well as the nail being loose. However at this time the patient refuses a total nail avulsion. All risks and complications were discussed with the patient which included a worsening infection as well as osteomyelitis. Patient understands the risks. -Sharp debridement of the loose nail was performed -She is to continue her course of antibiotics. -She is to perform Betadine soaks -Signs and symptoms of infection were discussed in detail. She understands that she is to call or come to the office department immediately  if  Infection worsens.  -Patient to return in 1 week or sooner if any problems were to arise. At the next appointment if there continues to be infection we will perform a total nail avulsion.

## 2014-02-20 ENCOUNTER — Encounter: Payer: Self-pay | Admitting: Nurse Practitioner

## 2014-02-21 ENCOUNTER — Telehealth: Payer: Self-pay | Admitting: Podiatry

## 2014-02-21 NOTE — Telephone Encounter (Signed)
Called patient to follow up with her. She states she has been continuing with the soaks. Denies any systemic complaints such as fever/chills/nausea/vomiting. She denies any erythema, streaking. Drainage decreased. F/u as scheduled or sooner if needed.

## 2014-02-26 ENCOUNTER — Encounter: Payer: Self-pay | Admitting: Podiatry

## 2014-02-26 ENCOUNTER — Ambulatory Visit (INDEPENDENT_AMBULATORY_CARE_PROVIDER_SITE_OTHER): Payer: Commercial Managed Care - HMO | Admitting: Podiatry

## 2014-02-26 VITALS — BP 163/88 | HR 76 | Resp 16 | Ht 61.0 in | Wt 203.0 lb

## 2014-02-26 DIAGNOSIS — B351 Tinea unguium: Secondary | ICD-10-CM

## 2014-02-26 DIAGNOSIS — L03039 Cellulitis of unspecified toe: Secondary | ICD-10-CM

## 2014-02-26 NOTE — Patient Instructions (Signed)
Continue with soaking. Monitor for any signs/symptoms of infection. Call immediately if any occur or go directly to the emergency room. Please call with any questions/concerns.

## 2014-02-26 NOTE — Progress Notes (Signed)
Subjective:     Patient ID: Adriana Franklin, female   DOB: October 26, 1940, 73 y.o.   MRN: [id number]  HPI Adriana Franklin presents today for a followup appointment of right hallux nail infection. Patient states that she has been continuing with the Betadine soaks as directed. He reports decreased drainage coming from around the site of the nail. She has finished her course of antibiotics at this time. Denies any systemic complaints such as fever,s chills, nausea, or vomiting.  Review of Systems  All other systems reviewed and are negative.      Objective:   Physical Exam  Nursing note and vitals reviewed. Constitutional: She is oriented to person, place, and time.  Musculoskeletal: She exhibits no edema and no tenderness.  Neurological: She is alert and oriented to person, place, and time.  Skin:  Right hallux nail with slight lifting of the nail distally however it appears to be firmly attached proximally. Small amount of serous drainage along the medial aspect of the nail. No purulence, erythema, ascending cellulitis. Bilateral hallux nails are too thick, brittle, yellow-brown discoloration.   DP/PT pulses palpable 2/4 b/l. CRT <3 sec.      Assessment:     Followup of right hallux paronychia.    Plan:     Due to the continued drainage and lifting of the nail again recommended a total nail avulsion to be performed at this time. However at this time the patient again does not want the nail removed. Discussed with her the nail may lift and falloff. I told her the nail starts to lift, become painful, any other signs of infection to contact the office for a total nail avulsion. Continue with the Betadine soaks at this time. Monitor for any signs or symptoms of infection. Patient urged to call the office immediately or go to the emergency room at any are to occur. Followup in 2 weeks or sooner if any problems are to arise.  Also discussed treatments, nail biopsy for possible onychomycosis. At this  time the patient states that she would like to try OTC treatments. Discussed hold off on treatment on the right for right now until the nail heels.

## 2014-03-19 ENCOUNTER — Ambulatory Visit: Payer: Commercial Managed Care - HMO | Admitting: Podiatry

## 2014-04-08 ENCOUNTER — Other Ambulatory Visit: Payer: Commercial Managed Care - HMO

## 2014-04-08 DIAGNOSIS — D509 Iron deficiency anemia, unspecified: Secondary | ICD-10-CM

## 2014-04-08 DIAGNOSIS — E063 Autoimmune thyroiditis: Secondary | ICD-10-CM

## 2014-04-09 LAB — CBC WITH DIFFERENTIAL
BASOS: 1 %
Basophils Absolute: 0 10*3/uL (ref 0.0–0.2)
Eos: 4 %
Eosinophils Absolute: 0.1 10*3/uL (ref 0.0–0.4)
HCT: 37.8 % (ref 34.0–46.6)
HEMOGLOBIN: 12.6 g/dL (ref 11.1–15.9)
IMMATURE GRANS (ABS): 0 10*3/uL (ref 0.0–0.1)
Immature Granulocytes: 0 %
Lymphocytes Absolute: 1.3 10*3/uL (ref 0.7–3.1)
Lymphs: 42 %
MCH: 26.1 pg — AB (ref 26.6–33.0)
MCHC: 33.3 g/dL (ref 31.5–35.7)
MCV: 78 fL — ABNORMAL LOW (ref 79–97)
MONOCYTES: 10 %
MONOS ABS: 0.3 10*3/uL (ref 0.1–0.9)
NEUTROS PCT: 43 %
Neutrophils Absolute: 1.4 10*3/uL (ref 1.4–7.0)
Platelets: 205 10*3/uL (ref 150–379)
RBC: 4.83 x10E6/uL (ref 3.77–5.28)
RDW: 18.9 % — ABNORMAL HIGH (ref 12.3–15.4)
WBC: 3.2 10*3/uL — AB (ref 3.4–10.8)

## 2014-04-09 LAB — FERRITIN: FERRITIN: 11 ng/mL — AB (ref 15–150)

## 2014-04-09 LAB — TSH: TSH: 2.65 u[IU]/mL (ref 0.450–4.500)

## 2014-04-10 ENCOUNTER — Encounter: Payer: Self-pay | Admitting: Nurse Practitioner

## 2014-04-10 ENCOUNTER — Ambulatory Visit (INDEPENDENT_AMBULATORY_CARE_PROVIDER_SITE_OTHER): Payer: Commercial Managed Care - HMO | Admitting: Nurse Practitioner

## 2014-04-10 VITALS — BP 132/80 | HR 87 | Temp 98.0°F | Resp 18 | Ht 61.0 in | Wt 203.6 lb

## 2014-04-10 DIAGNOSIS — R5381 Other malaise: Secondary | ICD-10-CM

## 2014-04-10 DIAGNOSIS — F411 Generalized anxiety disorder: Secondary | ICD-10-CM

## 2014-04-10 DIAGNOSIS — H269 Unspecified cataract: Secondary | ICD-10-CM

## 2014-04-10 DIAGNOSIS — E063 Autoimmune thyroiditis: Secondary | ICD-10-CM

## 2014-04-10 DIAGNOSIS — R5382 Chronic fatigue, unspecified: Secondary | ICD-10-CM

## 2014-04-10 DIAGNOSIS — R5383 Other fatigue: Secondary | ICD-10-CM

## 2014-04-10 DIAGNOSIS — D509 Iron deficiency anemia, unspecified: Secondary | ICD-10-CM

## 2014-04-10 DIAGNOSIS — E785 Hyperlipidemia, unspecified: Secondary | ICD-10-CM

## 2014-04-10 NOTE — Progress Notes (Signed)
Patient ID: Adriana Franklin, female   DOB: Nov 15, 1940, 73 y.o.   MRN: [id number]    Allergies  Allergen Reactions  . Onion     Chief Complaint  Patient presents with  . Medical Management of Chronic Issues    HPI: Patient is a 73 y.o. female seen in the office today for routine follow-up. Doing well, but still feeling fatigued, muscle weakness, continues to have trouble loosing weight. Has been taking Iron daily for iron def anemia labs have improved over last 3 months. Open to taking cholesterol medication but does not want to start until after iron has improved for fear that side effects of muscle cramping from the statin will worsen her current fatigue and muscle weakness. Pt requesting to be prescribed Vyvanse as she feels she may have binge eating disorder after an online search. On last visit requested diagnosis of ADD. Pt has yet to make an appt for psychiatrist as instructed. Has been taking Iron daily for iron def anemia labs have improved over last 3 months. Would like to consider taking cholesterol medication but does not want to start until after iron has improved. Pt was seen by eye doctor told she needed surgery for cataract in L eye. She would like a second opinion by ophthomology, concerned about a "mole" on her eye that the optometrist could no visualize d/t clouding from the cataract.  Review of Systems:  Review of Systems  Constitutional: Positive for malaise/fatigue. Negative for fever and chills.  Respiratory: Negative for cough and shortness of breath (on exertion).   Cardiovascular: Negative for chest pain and leg swelling.  Gastrointestinal: Negative for heartburn, abdominal pain, diarrhea, constipation and blood in stool.  Genitourinary: Negative for dysuria.  Musculoskeletal: Negative for myalgias.  Skin: Negative for itching and rash.  Neurological: Positive for weakness.  Psychiatric/Behavioral: Negative for depression. The patient is nervous/anxious.       Past Medical History  Diagnosis Date  . Hashimoto's disease   . Hyperlipidemia   . Fatigue   . Hernia   . Muscle pain   . Arthralgia   . Vertigo   . Anemia   . Allergy     itching  . Arthritis   . Obesity   . Sleep apnea   . Depression   . Muscle cramps   . Raynaud's syndrome   . Hearing loss   . Shortness of breath    Past Surgical History  Procedure Laterality Date  . Spine surgery  1946  . Appendectomy    . Tonsillectomy    . Knee arthroscopy Left   . Throat surgery  1992    for sleep apnea treatment   Social History:   reports that she has quit smoking. Her smoking use included Cigarettes. She has a 20 pack-year smoking history. She does not have any smokeless tobacco history on file. She reports that she does not drink alcohol or use illicit drugs.  Family History  Problem Relation Age of Onset  . Hypertension Mother   . Heart disease Father   . Learning disabilities Sister     Medications: Patient's Medications  New Prescriptions   No medications on file  Previous Medications   AMBULATORY NON FORMULARY MEDICATION    Medication Name: Ultimate H.A supplement: 2 by mouth daily   AMBULATORY NON FORMULARY MEDICATION    Medication Name: AstaFX- 2 by mouth daily   ASPIRIN 81 MG TABLET    Take 81 mg by mouth daily.   CHOLECALCIFEROL (VITAMIN  D3) 5000 UNITS CAPS    Take by mouth daily.   FERROUS GLUCONATE (IRON) 240 (27 FE) MG TABS    Take by mouth daily.   HYDROCHLOROTHIAZIDE (HYDRODIURIL) 25 MG TABLET    Take 25 mg by mouth as needed (For Swelling).   LEVOTHYROXINE (SYNTHROID, LEVOTHROID) 125 MCG TABLET    Take 125 mcg by mouth daily before breakfast.    OMEGA-3 ACID ETHYL ESTERS (LOVAZA) 1 G CAPSULE    Take 1 g by mouth daily.  Modified Medications   No medications on file  Discontinued Medications   CEPHALEXIN (KEFLEX) 500 MG CAPSULE    Take 1 capsule (500 mg total) by mouth 4 (four) times daily.   DOXYCYCLINE (VIBRA-TABS) 100 MG TABLET    Take 1  tablet (100 mg total) by mouth 2 (two) times daily.   VITAMIN B-12 (CYANOCOBALAMIN) 100 MCG TABLET    Take 100 mcg by mouth daily.     Physical Exam:  Filed Vitals:   04/10/14 0821  BP: 132/80  Pulse: 87  Temp: 98 F (36.7 C)  TempSrc: Oral  Resp: 18  Height: 5\' 1"  (1.549 m)  Weight: 203 lb 9.6 oz (92.352 kg)  SpO2: 97%    Physical Exam  Vitals reviewed. Constitutional: She is oriented to person, place, and time and well-developed, well-nourished, and in no distress.  HENT:  Head: Normocephalic and atraumatic.  Cardiovascular: Normal rate, regular rhythm and normal heart sounds.   Pulmonary/Chest: Effort normal and breath sounds normal. No respiratory distress.  Abdominal: Soft. Bowel sounds are normal. She exhibits no distension.  Musculoskeletal: Normal range of motion. She exhibits no edema and no tenderness.  Neurological: She is alert and oriented to person, place, and time.  Skin: Skin is warm and dry. She is not diaphoretic. No erythema.  Psychiatric: Affect normal.     Labs reviewed: Basic Metabolic Panel:  Recent Labs  12/26/13 0832 04/08/14 0820  NA 138  --   K 4.6  --   CL 99  --   CO2 22  --   GLUCOSE 105*  --   BUN 16  --   CREATININE 0.82  --   CALCIUM 8.9  --   TSH  --  2.650   Liver Function Tests:  Recent Labs  12/26/13 0832  AST 20  ALT 19  ALKPHOS 80  BILITOT 0.4  PROT 7.0   No results found for this basename: LIPASE, AMYLASE,  in the last 8760 hours No results found for this basename: AMMONIA,  in the last 8760 hours CBC:  Recent Labs  12/26/13 0832 04/08/14 0820  WBC 3.6 3.2*  NEUTROABS 1.7 1.4  HGB 11.0* 12.6  HCT 36.2 37.8  MCV 74* 78*  PLT  --  205   Lipid Panel:  Recent Labs  05/20/13 0808 12/26/13 0832  HDL 36* 38*  LDLCALC 177* 168*  TRIG 181* 243*  CHOLHDL 6.9* 6.7*  A1C: Lab Results  Component Value Date   HGBA1C 6.1* 02/25/2013    Lab Results  Component Value Date   TSH 2.650 04/08/2014     Assessment/Plan  1. Anemia, iron deficiency Continues taking iron daily - CBC With differential/Platelet; Future - Ferritin; Future  2. Chronic fatigue Improving, thyroid controlled Anemia improving  3. Other and unspecified hyperlipidemia Will wait to begin medication until anemia resolves  4. Hashimoto's disease Endocrine following as well Now taking synthroid 125 daily, reports feels better since medication adjustment, TSH WNL  5. Morbid obesity  Continues to reduce portion sizes Counseled on necessity to get 30 mins of exercise 3-5 times per week for weight loss and heart health -pt feels like she has binge eating disorder, will have her go to psychiatry for proper diagnosis   6. Anxiety state, unspecified   -will consider follow up with psychiatry regarding anxiety and possible ADD and binge eating disorder  7. Cataract - will follow up with [facility] for second opinion  Follow up in 3-4 month

## 2014-04-10 NOTE — Patient Instructions (Signed)
[facility] are both [facility]   Go to psychiatrist or psychologist for formal evaluation   Follow up in 3-4 months with blood work prior to visit

## 2014-06-16 ENCOUNTER — Encounter: Payer: Self-pay | Admitting: Internal Medicine

## 2014-06-19 ENCOUNTER — Ambulatory Visit (INDEPENDENT_AMBULATORY_CARE_PROVIDER_SITE_OTHER): Payer: Commercial Managed Care - HMO | Admitting: Nurse Practitioner

## 2014-06-19 ENCOUNTER — Encounter: Payer: Self-pay | Admitting: Nurse Practitioner

## 2014-06-19 VITALS — BP 126/78 | HR 89 | Temp 98.0°F | Resp 10 | Ht 62.0 in | Wt 206.0 lb

## 2014-06-19 DIAGNOSIS — R609 Edema, unspecified: Secondary | ICD-10-CM

## 2014-06-19 DIAGNOSIS — M542 Cervicalgia: Secondary | ICD-10-CM

## 2014-06-19 MED ORDER — HYDROCHLOROTHIAZIDE 25 MG PO TABS: 25.0000 mg | ORAL_TABLET | ORAL | Status: DC | PRN

## 2014-06-19 MED ORDER — TRAMADOL HCL 50 MG PO TABS: ORAL_TABLET | ORAL | Status: DC

## 2014-06-19 NOTE — Progress Notes (Signed)
Patient ID: Adriana Franklin, female   DOB: 1941-05-21, 73 y.o.   MRN: [id number]     PCP: Blanchie Serve, MD  Allergies  Allergen Reactions  . Onion     Chief Complaint  Patient presents with  . Acute Visit    Shoulder pain x several years, worse x several days. Patient also c/o SOB      HPI: Patient is a 73 y.o. female seen in the office today for worsening pain on left side. Reports left side seems disconnected from right with worsening pain that travels throughout the left side. Worse part is her shoulder. Having trouble holding things in left hand. Better when she moves. Reports pain as numbness, tingling, sharp.  Pt also reports progressive shortness of breath over the last year. Related this to lack of activity and weight gain Denies chest pains Review of Systems:  Review of Systems  Constitutional: Negative for activity change, appetite change, fatigue and unexpected weight change.  HENT: Negative for congestion and hearing loss.   Eyes: Negative.   Respiratory: Positive for shortness of breath (with increase activity ). Negative for cough.   Cardiovascular: Negative for chest pain, palpitations and leg swelling.  Gastrointestinal: Negative for abdominal pain, diarrhea and constipation.  Genitourinary: Negative for dysuria and difficulty urinating.  Musculoskeletal: Positive for arthralgias and neck pain. Negative for myalgias, back pain and gait problem.  Skin: Negative for color change and wound.  Neurological: Negative for dizziness and weakness.  Psychiatric/Behavioral: Negative for behavioral problems, confusion and agitation.    Past Medical History  Diagnosis Date  . Hashimoto's disease   . Hyperlipidemia   . Fatigue   . Hernia   . Muscle pain   . Arthralgia   . Vertigo   . Anemia   . Allergy     itching  . Arthritis   . Obesity   . Sleep apnea   . Depression   . Muscle cramps   . Raynaud's syndrome   . Hearing loss   . Shortness of breath     Past Surgical History  Procedure Laterality Date  . Spine surgery  1946  . Appendectomy    . Tonsillectomy    . Knee arthroscopy Left   . Throat surgery  1992    for sleep apnea treatment   Social History:   reports that she has quit smoking. Her smoking use included Cigarettes. She has a 20 pack-year smoking history. She does not have any smokeless tobacco history on file. She reports that she does not drink alcohol or use illicit drugs.  Family History  Problem Relation Age of Onset  . Hypertension Mother   . Heart disease Father   . Learning disabilities Sister     Medications: Patient's Medications  New Prescriptions   No medications on file  Previous Medications   AMBULATORY NON FORMULARY MEDICATION    Medication Name: Ultimate H.A supplement: 2 by mouth daily   AMBULATORY NON FORMULARY MEDICATION    Medication Name: AstaFX- 2 by mouth daily   ASPIRIN 81 MG TABLET    Take 81 mg by mouth daily.   CHOLECALCIFEROL (VITAMIN D3) 5000 UNITS CAPS    Take by mouth daily.   FERROUS GLUCONATE (IRON) 240 (27 FE) MG TABS    Take by mouth daily.   HYDROCHLOROTHIAZIDE (HYDRODIURIL) 25 MG TABLET    Take 25 mg by mouth as needed (For Swelling).   LEVOTHYROXINE (SYNTHROID, LEVOTHROID) 125 MCG TABLET    Take 125 mcg  by mouth daily before breakfast.    OMEGA-3 ACID ETHYL ESTERS (LOVAZA) 1 G CAPSULE    Take 1 g by mouth daily.  Modified Medications   No medications on file  Discontinued Medications   No medications on file     Physical Exam:  Filed Vitals:   06/19/14 1639  BP: 126/78  Pulse: 89  Temp: 98 F (36.7 C)  TempSrc: Oral  Resp: 10  Height: 5\' 2"  (1.575 m)  Weight: 206 lb (93.441 kg)  SpO2: 97%    Physical Exam  Constitutional: She is oriented to person, place, and time. She appears well-developed and well-nourished. No distress.  HENT:  Head: Normocephalic and atraumatic.  Eyes: Conjunctivae and EOM are normal. Pupils are equal, round, and reactive to light.   Neck: Normal range of motion. Neck supple.  Cardiovascular: Normal rate, regular rhythm and normal heart sounds.   Pulmonary/Chest: Effort normal and breath sounds normal. No respiratory distress. She has no wheezes. She has no rales. She exhibits no tenderness.  Musculoskeletal: Normal range of motion. She exhibits tenderness (to back of neck and left shoulder). She exhibits no edema.  Neurological: She is alert and oriented to person, place, and time.  Skin: Skin is warm and dry. She is not diaphoretic.  Psychiatric: She has a normal mood and affect.    Labs reviewed: Basic Metabolic Panel:  Recent Labs  12/26/13 0832 04/08/14 0820  NA 138  --   K 4.6  --   CL 99  --   CO2 22  --   GLUCOSE 105*  --   BUN 16  --   CREATININE 0.82  --   CALCIUM 8.9  --   TSH  --  2.650   Liver Function Tests:  Recent Labs  12/26/13 0832  AST 20  ALT 19  ALKPHOS 80  BILITOT 0.4  PROT 7.0   No results for input(s): LIPASE, AMYLASE in the last 8760 hours. No results for input(s): AMMONIA in the last 8760 hours. CBC:  Recent Labs  12/26/13 0832 04/08/14 0820  WBC 3.6 3.2*  NEUTROABS 1.7 1.4  HGB 11.0* 12.6  HCT 36.2 37.8  MCV 74* 78*  PLT  --  205   Lipid Panel:  Recent Labs  12/26/13 0832  HDL 38*  LDLCALC 168*  TRIG 243*  CHOLHDL 6.7*   TSH:  Recent Labs  04/08/14 0820  TSH 2.650   A1C: Lab Results  Component Value Date   HGBA1C 6.1* 02/25/2013     Assessment/Plan 1. Neck pain progressively worse, will get xray -may use as needed tramadol -heating pad PRN - DG Cervical Spine Complete; Future - traMADol (ULTRAM) 50 MG tablet; 1-2 every 8 hours as needed  Dispense: 60 tablet; Refill: 3  2. Edema -off and on, stable at this time Educated on low sodium diet, elevating LE, and TEDs as needed as well  - hydrochlorothiazide (HYDRODIURIL) 25 MG tablet; Take 1 tablet (25 mg total) by mouth as needed (For Swelling).  Dispense: 30 tablet; Refill: 3

## 2014-07-28 ENCOUNTER — Encounter: Payer: Self-pay | Admitting: Nurse Practitioner

## 2014-07-28 ENCOUNTER — Ambulatory Visit
Admission: RE | Admit: 2014-07-28 | Discharge: 2014-07-28 | Disposition: A | Discharge status: Home / Self Care | Payer: Medicare HMO | Source: Ambulatory Visit | Attending: Nurse Practitioner | Admitting: Nurse Practitioner

## 2014-07-28 ENCOUNTER — Ambulatory Visit (INDEPENDENT_AMBULATORY_CARE_PROVIDER_SITE_OTHER): Payer: Medicare HMO | Admitting: Nurse Practitioner

## 2014-07-28 VITALS — BP 144/94 | HR 71 | Temp 98.2°F | Wt 206.0 lb

## 2014-07-28 DIAGNOSIS — M25512 Pain in left shoulder: Secondary | ICD-10-CM

## 2014-07-28 DIAGNOSIS — R0789 Other chest pain: Secondary | ICD-10-CM

## 2014-07-28 DIAGNOSIS — E785 Hyperlipidemia, unspecified: Secondary | ICD-10-CM

## 2014-07-28 DIAGNOSIS — K219 Gastro-esophageal reflux disease without esophagitis: Secondary | ICD-10-CM

## 2014-07-28 DIAGNOSIS — D509 Iron deficiency anemia, unspecified: Secondary | ICD-10-CM

## 2014-07-28 DIAGNOSIS — M542 Cervicalgia: Secondary | ICD-10-CM

## 2014-07-28 MED ORDER — OMEPRAZOLE 20 MG PO CPDR: 20.0000 mg | DELAYED_RELEASE_CAPSULE | Freq: Every day | ORAL | Status: DC

## 2014-07-28 NOTE — Patient Instructions (Signed)
Please make appt for new GI doctor for ongoing pain associated with hernia   Will place cardiology referral- if you would like to call and give [place] a preference on which cardiologist you want to see that is okay  Recommend you starting medication for acid reflux

## 2014-07-28 NOTE — Progress Notes (Signed)
Patient ID: Adriana Franklin, female   DOB: 1941-07-08, 74 y.o.   MRN: [id number]    PCP: Blanchie Serve, MD  Allergies  Allergen Reactions  . Onion     Chief Complaint  Patient presents with  . Acute Visit    pt wants EKG. off and on SOB, chest pains, left arm pain.      HPI: Patient is a 74 y.o. female seen in the office today because she wants an EKG. At last visit she noted shortness of breath off and on. Now worried this may cardiac related. Had through cardiac work up in 2013 which she said they reported enlarged heart and leaky valve. Went to Dr Wynonia Lawman for echo, ekg, and stress test. Told her there was no reason to follow up everything was WNL  Does not want to be on cholesterol medications despite high cholesterol. Reports she has always eaten healthy. Cooks for scratch, lean meats. Eats brown rice and pasta frequently.  Goes to [facility] 3 times a week-- 30 mins.  Has hiatal hernia and does have intermittent chest pains-- this is not new but would like to rule out cardiac problems-- does not take PPI and went to GI but never followed up or did endoscopy Also has been having pains in her shoulder but this is also not new Pt reports she would like her handicap sticker filled out, reports she needs it when it rains at [employer]. Review of Systems:  Review of Systems  Constitutional: Negative for activity change, appetite change, fatigue and unexpected weight change.  HENT: Negative for congestion and hearing loss.   Eyes: Negative.   Respiratory: Positive for shortness of breath (with increase activity ). Negative for cough.   Cardiovascular: Negative for chest pain, palpitations and leg swelling.  Gastrointestinal: Negative for abdominal pain, diarrhea and constipation.  Genitourinary: Negative for dysuria and difficulty urinating.  Musculoskeletal: Positive for arthralgias and neck pain. Negative for myalgias, back pain and gait problem.  Skin: Negative for color change and wound.    Neurological: Negative for dizziness and weakness.  Psychiatric/Behavioral: Negative for behavioral problems, confusion and agitation.    Past Medical History  Diagnosis Date  . Hashimoto's disease   . Hyperlipidemia   . Fatigue   . Hernia   . Muscle pain   . Arthralgia   . Vertigo   . Anemia   . Allergy     itching  . Arthritis   . Obesity   . Sleep apnea   . Depression   . Muscle cramps   . Raynaud's syndrome   . Hearing loss   . Shortness of breath    Past Surgical History  Procedure Laterality Date  . Spine surgery  1946  . Appendectomy    . Tonsillectomy    . Knee arthroscopy Left   . Throat surgery  1992    for sleep apnea treatment   Social History:   reports that she quit smoking about 30 years ago. Her smoking use included Cigarettes. She has a 20 pack-year smoking history. She has never used smokeless tobacco. She reports that she does not drink alcohol or use illicit drugs.  Family History  Problem Relation Age of Onset  . Hypertension Mother   . Heart disease Father   . Learning disabilities Sister     Medications: Patient's Medications  New Prescriptions   No medications on file  Previous Medications   AMBULATORY NON FORMULARY MEDICATION    Medication Name: Ultimate H.A supplement:  2 by mouth daily   AMBULATORY NON FORMULARY MEDICATION    Medication Name: AstaFX- 2 by mouth daily   ASPIRIN 81 MG TABLET    Take 81 mg by mouth daily.   CHOLECALCIFEROL (VITAMIN D3) 5000 UNITS CAPS    Take by mouth daily.   FERROUS GLUCONATE (IRON) 240 (27 FE) MG TABS    Take by mouth daily.   HYDROCHLOROTHIAZIDE (HYDRODIURIL) 25 MG TABLET    Take 1 tablet (25 mg total) by mouth as needed (For Swelling).   LEVOTHYROXINE (SYNTHROID, LEVOTHROID) 125 MCG TABLET    Take 125 mcg by mouth daily before breakfast.    OMEGA-3 ACID ETHYL ESTERS (LOVAZA) 1 G CAPSULE    Take 1 g by mouth daily.   TRAMADOL (ULTRAM) 50 MG TABLET    1-2 every 8 hours as needed  Modified  Medications   No medications on file  Discontinued Medications   No medications on file     Physical Exam:  Filed Vitals:   07/28/14 1332  BP: 144/94  Pulse: 71  Temp: 98.2 F (36.8 C)  TempSrc: Oral  Weight: 206 lb (93.441 kg)  SpO2: 94%    Physical Exam  Constitutional: She is oriented to person, place, and time. She appears well-developed and well-nourished. No distress.  HENT:  Head: Normocephalic and atraumatic.  Eyes: Conjunctivae and EOM are normal. Pupils are equal, round, and reactive to light.  Neck: Normal range of motion. Neck supple.  Cardiovascular: Normal rate, regular rhythm and normal heart sounds.   Pulmonary/Chest: Effort normal and breath sounds normal. No respiratory distress. She has no wheezes. She has no rales. She exhibits no tenderness.  Abdominal: Soft. Bowel sounds are normal. She exhibits no distension. There is no tenderness.  Musculoskeletal: Normal range of motion. She exhibits tenderness (to back of neck and left shoulder). She exhibits no edema.  Neurological: She is alert and oriented to person, place, and time.  Skin: Skin is warm and dry. She is not diaphoretic.  Psychiatric: She has a normal mood and affect.    Labs reviewed: Basic Metabolic Panel:  Recent Labs  12/26/13 0832 04/08/14 0820  NA 138  --   K 4.6  --   CL 99  --   CO2 22  --   GLUCOSE 105*  --   BUN 16  --   CREATININE 0.82  --   CALCIUM 8.9  --   TSH  --  2.650   Liver Function Tests:  Recent Labs  12/26/13 0832  AST 20  ALT 19  ALKPHOS 80  BILITOT 0.4  PROT 7.0   No results for input(s): LIPASE, AMYLASE in the last 8760 hours. No results for input(s): AMMONIA in the last 8760 hours. CBC:  Recent Labs  12/26/13 0832 04/08/14 0820  WBC 3.6 3.2*  NEUTROABS 1.7 1.4  HGB 11.0* 12.6  HCT 36.2 37.8  MCV 74* 78*  PLT  --  205   Lipid Panel:  Recent Labs  12/26/13 0832  HDL 38*  LDLCALC 168*  TRIG 243*  CHOLHDL 6.7*   TSH:  Recent  Labs  04/08/14 0820  TSH 2.650   A1C: Lab Results  Component Value Date   HGBA1C 6.1* 02/25/2013     Assessment/Plan  1. Gastroesophageal reflux disease without esophagitis -pt went to GI last year however never followed through with recommendations, ongoing education provided. Encouraged to follow up -to start PPI at this time - omeprazole (PRILOSEC) 20 MG capsule; Take  1 capsule (20 mg total) by mouth daily.  Dispense: 30 capsule; Refill: 3  2. Hyperlipidemia -has considered medications in the past however would like to make sure anemia has improved before starting medications.  - Comprehensive metabolic panel  3. Left shoulder pain -ongoing pain, never got imaging from previous visit.  - DG Shoulder Left; Future  4. Chest discomfort -reports this is most likely due to [facility], would like EKG to rule out cardiac. Pt reports after being evalated with cardiology they did not recommend follow up. offered referral for ongoing evaluation but she does not wish to follow through with this at this time.  - EKG 12-Lead  5. Anemia, iron deficiency -will follow up labs  - CBC With differential/Platelet - Iron and TIBC - Ferritin  Follow up in 6 months, sooner if needed

## 2014-07-29 LAB — COMPREHENSIVE METABOLIC PANEL
A/G RATIO: 1.6 (ref 1.1–2.5)
ALBUMIN: 4.2 g/dL (ref 3.5–4.8)
ALT: 22 IU/L (ref 0–32)
AST: 20 IU/L (ref 0–40)
Alkaline Phosphatase: 82 IU/L (ref 39–117)
BUN/Creatinine Ratio: 24 (ref 11–26)
BUN: 18 mg/dL (ref 8–27)
CO2: 25 mmol/L (ref 18–29)
Calcium: 9.2 mg/dL (ref 8.7–10.3)
Chloride: 102 mmol/L (ref 97–108)
Creatinine, Ser: 0.74 mg/dL (ref 0.57–1.00)
GFR, EST AFRICAN AMERICAN: 94 mL/min/{1.73_m2} (ref >59)
GFR, EST NON AFRICAN AMERICAN: 82 mL/min/{1.73_m2} (ref >59)
GLUCOSE: 91 mg/dL (ref 65–99)
Globulin, Total: 2.7 g/dL (ref 1.5–4.5)
Potassium: 4.5 mmol/L (ref 3.5–5.2)
Sodium: 142 mmol/L (ref 134–144)
TOTAL PROTEIN: 6.9 g/dL (ref 6.0–8.5)
Total Bilirubin: 0.5 mg/dL (ref 0.0–1.2)

## 2014-07-29 LAB — FERRITIN: Ferritin: 10 ng/mL — ABNORMAL LOW (ref 15–150)

## 2014-07-29 LAB — IRON AND TIBC
Iron Saturation: 8 % — CL (ref 15–55)
Iron: 33 ug/dL — ABNORMAL LOW (ref 35–155)
TIBC: 420 ug/dL (ref 250–450)
UIBC: 387 ug/dL — ABNORMAL HIGH (ref 150–375)

## 2014-07-29 LAB — CBC WITH DIFFERENTIAL
BASOS ABS: 0 10*3/uL (ref 0.0–0.2)
BASOS: 1 %
EOS ABS: 0.1 10*3/uL (ref 0.0–0.4)
Eos: 3 %
HCT: 37.9 % (ref 34.0–46.6)
HEMOGLOBIN: 12.8 g/dL (ref 11.1–15.9)
Immature Grans (Abs): 0 10*3/uL (ref 0.0–0.1)
Immature Granulocytes: 0 %
LYMPHS ABS: 1.8 10*3/uL (ref 0.7–3.1)
LYMPHS: 42 %
MCH: 26.8 pg (ref 26.6–33.0)
MCHC: 33.8 g/dL (ref 31.5–35.7)
MCV: 79 fL (ref 79–97)
Monocytes Absolute: 0.3 10*3/uL (ref 0.1–0.9)
Monocytes: 8 %
NEUTROS ABS: 2 10*3/uL (ref 1.4–7.0)
Neutrophils Relative %: 46 %
PLATELETS: 243 10*3/uL (ref 150–379)
RBC: 4.78 x10E6/uL (ref 3.77–5.28)
RDW: 15.5 % — ABNORMAL HIGH (ref 12.3–15.4)
WBC: 4.3 10*3/uL (ref 3.4–10.8)

## 2014-07-30 ENCOUNTER — Encounter: Payer: Self-pay | Admitting: Internal Medicine

## 2014-08-01 ENCOUNTER — Other Ambulatory Visit: Payer: Self-pay | Admitting: Family Medicine

## 2014-08-01 DIAGNOSIS — R1310 Dysphagia, unspecified: Secondary | ICD-10-CM

## 2014-08-05 ENCOUNTER — Other Ambulatory Visit: Payer: Medicare HMO

## 2014-08-11 ENCOUNTER — Other Ambulatory Visit: Payer: Commercial Managed Care - HMO

## 2014-08-12 ENCOUNTER — Inpatient Hospital Stay: Admission: RE | Admit: 2014-08-12 | Payer: Medicare HMO | Source: Ambulatory Visit

## 2014-08-14 ENCOUNTER — Ambulatory Visit: Payer: Commercial Managed Care - HMO | Admitting: Nurse Practitioner

## 2014-08-18 ENCOUNTER — Other Ambulatory Visit: Payer: Medicare HMO

## 2014-08-18 ENCOUNTER — Telehealth: Payer: Self-pay | Admitting: *Deleted

## 2014-08-18 ENCOUNTER — Encounter: Payer: Self-pay | Admitting: Internal Medicine

## 2014-08-18 NOTE — Telephone Encounter (Signed)
Spoke with patient regarding lab (lipid panel), she stated that she would come get it done close to her next appointment which will be in July.

## 2014-08-22 ENCOUNTER — Other Ambulatory Visit: Payer: Self-pay | Admitting: Family Medicine

## 2014-08-22 ENCOUNTER — Ambulatory Visit
Admission: RE | Admit: 2014-08-22 | Discharge: 2014-08-22 | Disposition: A | Discharge status: Home / Self Care | Payer: Medicare HMO | Source: Ambulatory Visit | Attending: Family Medicine | Admitting: Family Medicine

## 2014-08-22 DIAGNOSIS — R1310 Dysphagia, unspecified: Secondary | ICD-10-CM

## 2014-09-09 ENCOUNTER — Encounter: Payer: Self-pay | Admitting: Internal Medicine

## 2014-12-02 ENCOUNTER — Other Ambulatory Visit: Payer: Self-pay | Admitting: Otolaryngology

## 2014-12-02 DIAGNOSIS — H9190 Unspecified hearing loss, unspecified ear: Secondary | ICD-10-CM

## 2014-12-02 DIAGNOSIS — H905 Unspecified sensorineural hearing loss: Secondary | ICD-10-CM

## 2014-12-02 DIAGNOSIS — H9313 Tinnitus, bilateral: Secondary | ICD-10-CM

## 2014-12-02 DIAGNOSIS — R42 Dizziness and giddiness: Secondary | ICD-10-CM

## 2014-12-16 ENCOUNTER — Ambulatory Visit
Admission: RE | Admit: 2014-12-16 | Discharge: 2014-12-16 | Disposition: A | Discharge status: Home / Self Care | Payer: Medicare HMO | Source: Ambulatory Visit | Attending: Otolaryngology | Admitting: Otolaryngology

## 2014-12-16 DIAGNOSIS — H9190 Unspecified hearing loss, unspecified ear: Secondary | ICD-10-CM

## 2014-12-16 DIAGNOSIS — R42 Dizziness and giddiness: Secondary | ICD-10-CM

## 2014-12-16 DIAGNOSIS — H905 Unspecified sensorineural hearing loss: Secondary | ICD-10-CM

## 2014-12-16 DIAGNOSIS — H9313 Tinnitus, bilateral: Secondary | ICD-10-CM

## 2014-12-16 MED ORDER — GADOBENATE DIMEGLUMINE 529 MG/ML IV SOLN
19.0000 mL | Freq: Once | INTRAVENOUS | Status: AC | PRN
Start: 2014-12-16 — End: 2014-12-16
  Administered 2014-12-16: 19 mL via INTRAVENOUS

## 2015-01-06 ENCOUNTER — Telehealth: Payer: Self-pay | Admitting: *Deleted

## 2015-01-07 NOTE — Telephone Encounter (Signed)
Oncology Nurse Navigator Documentation  Oncology Nurse Navigator Flowsheets 01/06/2015  Referral date to RadOnc/MedOnc 01/06/2015  Navigator Encounter Type Introductory phone call  Placed introductory call to new referral patient. 1. Introduced myself as the oncology nurse navigator that works with Dr. Isidore Moos to whom she has been referred by Dr. Janace Hoard and with whom she has an appt on 01/16/15 @ 0730/0800.  She confirmed understanding of referral and appt date/time. 2. I briefly explained my role as a navigator, indicated that I would be joining her during his appt next week. 3. I confirmed understanding of the [facility] location, explained arrival and RadOnc registration process for appt. 4. I provided my contact information, encouraged her to call with questions/concerns before next week. 5. She verbalized understanding of information provided, expressed appreciation for my call.   Time Spent with Patient Moraga, RN, BSN, [place] [facility] [place] [phone]

## 2015-01-12 ENCOUNTER — Telehealth: Payer: Self-pay | Admitting: *Deleted

## 2015-01-12 NOTE — Telephone Encounter (Signed)
Notified Dr. Janace Hoard office that patient cancelled her appointment.

## 2015-01-16 ENCOUNTER — Ambulatory Visit: Payer: Medicare HMO | Admitting: Radiation Oncology

## 2015-01-16 ENCOUNTER — Ambulatory Visit: Payer: Medicare HMO

## 2015-01-27 ENCOUNTER — Ambulatory Visit: Payer: Medicare HMO | Admitting: Internal Medicine

## 2015-01-28 ENCOUNTER — Encounter: Payer: Self-pay | Admitting: Internal Medicine

## 2015-01-28 ENCOUNTER — Ambulatory Visit (INDEPENDENT_AMBULATORY_CARE_PROVIDER_SITE_OTHER): Payer: Medicare HMO | Admitting: Internal Medicine

## 2015-01-28 VITALS — BP 136/80 | HR 78 | Temp 97.4°F | Resp 20 | Ht 62.0 in | Wt 203.4 lb

## 2015-01-28 DIAGNOSIS — R609 Edema, unspecified: Secondary | ICD-10-CM

## 2015-01-28 DIAGNOSIS — H269 Unspecified cataract: Secondary | ICD-10-CM

## 2015-01-28 DIAGNOSIS — E063 Autoimmune thyroiditis: Secondary | ICD-10-CM

## 2015-01-28 DIAGNOSIS — D509 Iron deficiency anemia, unspecified: Secondary | ICD-10-CM | POA: Diagnosis not present

## 2015-01-28 DIAGNOSIS — E785 Hyperlipidemia, unspecified: Secondary | ICD-10-CM

## 2015-01-28 MED ORDER — HYDROCHLOROTHIAZIDE 25 MG PO TABS: 25.0000 mg | ORAL_TABLET | ORAL | Status: AC | PRN

## 2015-01-28 NOTE — Progress Notes (Signed)
Patient ID: Adriana Franklin, female   DOB: Jun 30, 1941, 74 y.o.   MRN: [id number]    Location:    [facility]   Place of Service:   OFFICE  Chief Complaint  Patient presents with  . Follow-up    HPI:  74 yo female seen today for f/u. She reports feeling well overall. She has generalized arthralgias that improves with exercise. She was dx with a right acoustic neuroma by Dr Janace Hoard. It is benign. She has reduced hearing on right with associated tinnitus. Occasional loss of balance  She takes levothyroxine for thyroid d/o.  She takes joint supplements for OA  HCTZ helps edema. She takes it prn  She has not started iron supplements as she finds it difficult to remember to take them 4 hrs after taking thyroid med  She binge eats and would like to try vyvanse. explained to pt that a possible side effect of med is tachycardia and with thyroid d/o, may not be safe for her to take.  Past Medical History  Diagnosis Date  . Hashimoto's disease   . Hyperlipidemia   . Fatigue   . Hernia   . Muscle pain   . Arthralgia   . Vertigo   . Anemia   . Allergy     itching  . Arthritis   . Obesity   . Sleep apnea   . Depression   . Muscle cramps   . Raynaud's syndrome   . Hearing loss   . Shortness of breath     Past Surgical History  Procedure Laterality Date  . Spine surgery  1946  . Appendectomy    . Tonsillectomy    . Knee arthroscopy Left   . Throat surgery  1992    for sleep apnea treatment    Patient Care Team: Gildardo Cranker, DO as PCP - General (Internal Medicine) Leota Sauers, RN as Oncology Nurse Navigator Eppie Gibson, MD as Attending Physician (Radiation Oncology)  History   Social History  . Marital Status: Married    Spouse Name: N/A  . Number of Children: 1  . Years of Education: N/A   Occupational History  . retired    Social History Main Topics  . Smoking status: Former Smoker -- 2.00 packs/day for 10 years    Types: Cigarettes    Quit date: 07/28/1984    . Smokeless tobacco: Never Used  . Alcohol Use: No  . Drug Use: No  . Sexual Activity: Yes   Other Topics Concern  . Not on file   Social History Narrative     reports that she quit smoking about 30 years ago. Her smoking use included Cigarettes. She has a 20 pack-year smoking history. She has never used smokeless tobacco. She reports that she does not drink alcohol or use illicit drugs.  Allergies  Allergen Reactions  . Onion     Medications: Patient's Medications  New Prescriptions   No medications on file  Previous Medications   AMBULATORY NON FORMULARY MEDICATION    Medication Name: Ultimate H.A supplement: 2 by mouth daily   AMBULATORY NON FORMULARY MEDICATION    Medication Name: AstaFX- 2 by mouth daily   ASPIRIN 81 MG TABLET    Take 81 mg by mouth daily.   CHOLECALCIFEROL (VITAMIN D3) 5000 UNITS CAPS    Take by mouth daily.   FERROUS GLUCONATE (IRON) 240 (27 FE) MG TABS    Take by mouth daily.   LEVOTHYROXINE (SYNTHROID, LEVOTHROID) 125 MCG TABLET  Take 125 mcg by mouth daily before breakfast.    OMEGA-3 ACID ETHYL ESTERS (LOVAZA) 1 G CAPSULE    Take 1 g by mouth daily.  Modified Medications   Modified Medication Previous Medication   HYDROCHLOROTHIAZIDE (HYDRODIURIL) 25 MG TABLET hydrochlorothiazide (HYDRODIURIL) 25 MG tablet      Take 1 tablet (25 mg total) by mouth as needed (For Swelling).    Take 1 tablet (25 mg total) by mouth as needed (For Swelling).  Discontinued Medications   OMEPRAZOLE (PRILOSEC) 20 MG CAPSULE    Take 1 capsule (20 mg total) by mouth daily.   TRAMADOL (ULTRAM) 50 MG TABLET    1-2 every 8 hours as needed    Review of Systems  Constitutional: Negative for fever, chills, diaphoresis, activity change, appetite change and fatigue.  HENT: Positive for hearing loss and tinnitus. Negative for ear pain and sore throat.   Eyes: Positive for visual disturbance (seeing halos around lights and unable to drive at night).  Respiratory: Negative for  cough, chest tightness and shortness of breath.   Cardiovascular: Negative for chest pain, palpitations and leg swelling.  Gastrointestinal: Negative for nausea, vomiting, abdominal pain, diarrhea, constipation and blood in stool.  Genitourinary: Negative for dysuria.  Musculoskeletal: Negative for arthralgias.  Neurological: Negative for dizziness, tremors, numbness and headaches.  Psychiatric/Behavioral: Negative for sleep disturbance. The patient is not nervous/anxious.     Filed Vitals:   01/28/15 0921  BP: 136/80  Pulse: 78  Temp: 97.4 F (36.3 C)  TempSrc: Oral  Resp: 20  Height: 5\' 2"  (1.575 m)  Weight: 203 lb 6.4 oz (92.262 kg)  SpO2: 96%   Body mass index is 37.19 kg/(m^2).  Physical Exam  Constitutional: She is oriented to person, place, and time. She appears well-developed and well-nourished. No distress.  HENT:  Mouth/Throat: Oropharynx is clear and moist. No oropharyngeal exudate.  Eyes: Pupils are equal, round, and reactive to light. No scleral icterus.  Neck: Neck supple. Carotid bruit is not present. No tracheal deviation present. No thyromegaly present.  Cardiovascular: Normal rate, regular rhythm, normal heart sounds and intact distal pulses.  Exam reveals no gallop and no friction rub.   No murmur heard. Trace LE edema b/l. no calf TTP. L>R calf varicose veins, soft, NT  Pulmonary/Chest: Effort normal and breath sounds normal. No stridor. No respiratory distress. She has no wheezes. She has no rales.  Abdominal: Soft. Bowel sounds are normal. She exhibits no distension and no mass. There is no hepatomegaly. There is no tenderness. There is no rebound and no guarding.  Musculoskeletal: She exhibits edema and tenderness.  Lymphadenopathy:    She has no cervical adenopathy.  Neurological: She is alert and oriented to person, place, and time.  Skin: Skin is warm and dry. No rash noted.  Psychiatric: She has a normal mood and affect. Her behavior is normal.  Judgment and thought content normal.     Labs reviewed: No visits with results within 3 Month(s) from this visit. Latest known visit with results is:  Office Visit on 07/28/2014  Component Date Value Ref Range Status  . WBC 07/28/2014 4.3  3.4 - 10.8 x10E3/uL Final  . RBC 07/28/2014 4.78  3.77 - 5.28 x10E6/uL Final  . Hemoglobin 07/28/2014 12.8  11.1 - 15.9 g/dL Final  . HCT 07/28/2014 37.9  34.0 - 46.6 % Final  . MCV 07/28/2014 79  79 - 97 fL Final  . MCH 07/28/2014 26.8  26.6 - 33.0 pg Final  .  MCHC 07/28/2014 33.8  31.5 - 35.7 g/dL Final  . RDW 07/28/2014 15.5* 12.3 - 15.4 % Final  . Platelets 07/28/2014 243  150 - 379 x10E3/uL Final  . Neutrophils Relative % 07/28/2014 46   Final  . Lymphs 07/28/2014 42   Final  . Monocytes 07/28/2014 8   Final  . Eos 07/28/2014 3   Final  . Basos 07/28/2014 1   Final  . Neutrophils Absolute 07/28/2014 2.0  1.4 - 7.0 x10E3/uL Final  . Lymphocytes Absolute 07/28/2014 1.8  0.7 - 3.1 x10E3/uL Final  . Monocytes Absolute 07/28/2014 0.3  0.1 - 0.9 x10E3/uL Final  . Eosinophils Absolute 07/28/2014 0.1  0.0 - 0.4 x10E3/uL Final  . Basophils Absolute 07/28/2014 0.0  0.0 - 0.2 x10E3/uL Final  . Immature Granulocytes 07/28/2014 0   Final  . Immature Grans (Abs) 07/28/2014 0.0  0.0 - 0.1 x10E3/uL Final  . TIBC 07/28/2014 420  250 - 450 ug/dL Final  . UIBC 07/28/2014 387* 150 - 375 ug/dL Final   Comment: **Effective August 04, 2014 the reference interval**   for UIBC will be changing to:                    Female                             0 - 30 days      Not Estab.                             1 -  6 months   127 - 340                      7 months - 17 years    148 - 395                                >17 years    60 - 76                    Female                             0 - 30 days     Not Estab.                             1 -  6 months   127 - 340                      7 months - 60 years    131 - 425                                >60  years    118 - 369   . Iron 07/28/2014 33* 35 - 155 ug/dL Final   Comment: **Effective August 04, 2014 the reference interval**   for Iron, Serum will be changing to:                       Female  0 - 30 days      35 - 160                       1 month -  1 year      18 - 126                             2 - 12 years     41 - 147                            58 - 17 years     75 - 169                                >17 years     17 - 52                       Female                             0 - 30 days      27 - 133                       1 month -  1 year      18 - 126                             2 - 12 years     84 - 147                            50 - 17 years     63 - 169                            45 - 60 years     6 - 159                                >60 years     22 - 139   . Iron Saturation 07/28/2014 8* 15 - 55 % Final  . Ferritin 07/28/2014 10* 15 - 150 ng/mL Final  . Glucose 07/28/2014 91  65 - 99 mg/dL Final  . BUN 07/28/2014 18  8 - 27 mg/dL Final  . Creatinine, Ser 07/28/2014 0.74  0.57 - 1.00 mg/dL Final  . GFR calc non Af Amer 07/28/2014 82  >59 mL/min/1.73 Final  . GFR calc Af Amer 07/28/2014 94  >59 mL/min/1.73 Final  . BUN/Creatinine Ratio 07/28/2014 24  11 - 26 Final  . Sodium 07/28/2014 142  134 - 144 mmol/L Final  . Potassium 07/28/2014 4.5  3.5 - 5.2 mmol/L Final  . Chloride 07/28/2014 102  97 - 108 mmol/L Final  . CO2 07/28/2014 25  18 - 29 mmol/L Final  . Calcium 07/28/2014 9.2  8.7 - 10.3 mg/dL Final  . Total Protein 07/28/2014 6.9  6.0 - 8.5 g/dL Final  . Albumin 07/28/2014 4.2  3.5 - 4.8 g/dL Final  . Globulin, Total 07/28/2014 2.7  1.5 - 4.5 g/dL  Final  . Albumin/Globulin Ratio 07/28/2014 1.6  1.1 - 2.5 Final  . Total Bilirubin 07/28/2014 0.5  0.0 - 1.2 mg/dL Final  . Alkaline Phosphatase 07/28/2014 82  39 - 117 IU/L Final  . AST 07/28/2014 20  0 - 40 IU/L Final  . ALT 07/28/2014 22  0 - 32 IU/L Final    No results  found.   Assessment/Plan    ICD-9-CM ICD-10-CM   1. Edema - stable 782.3 R60.9 hydrochlorothiazide (HYDRODIURIL) 25 MG tablet  2. Hashimoto's disease - stable 245.2 E06.3   3. Hyperlipidemia - uncontrolled 272.4 E78.5 Lipid Panel  4. Anemia, iron deficiency - unchanged 280.9 D50.9   5. Cataract 366.9 H26.9    --Recommend you start iron supplement at NIGHT. Will recheck iron levels at next visit  --Return to office in 6 mos for CPE. Will check iron levels at that visit  --Continue other medications as ordered  --Recommend you call and make appt to have cataracts removed  Jonathan Corp[facility] S. Perlie Gold  [facility] [address] [place],  [address] [phone] Cell (Monday-Friday 8 AM - 5 PM) [phone] After 5 PM and follow prompts

## 2015-01-28 NOTE — Patient Instructions (Addendum)
Recommend you start iron supplement at NIGHT. Will recheck iron levels at next visit  Return to office in 6 mos for CPE. Will check iron levels at that visit  Continue other medications as ordered  Recommend you call and make appt to have cataracts removed

## 2015-02-09 ENCOUNTER — Encounter: Payer: Self-pay | Admitting: Nurse Practitioner

## 2015-04-29 DIAGNOSIS — H02831 Dermatochalasis of right upper eyelid: Secondary | ICD-10-CM | POA: Diagnosis not present

## 2015-04-29 DIAGNOSIS — H25813 Combined forms of age-related cataract, bilateral: Secondary | ICD-10-CM | POA: Diagnosis not present

## 2015-04-29 DIAGNOSIS — D3132 Benign neoplasm of left choroid: Secondary | ICD-10-CM | POA: Diagnosis not present

## 2015-04-29 DIAGNOSIS — H02834 Dermatochalasis of left upper eyelid: Secondary | ICD-10-CM | POA: Diagnosis not present

## 2015-05-07 DIAGNOSIS — J069 Acute upper respiratory infection, unspecified: Secondary | ICD-10-CM | POA: Diagnosis not present

## 2015-05-22 DIAGNOSIS — K219 Gastro-esophageal reflux disease without esophagitis: Secondary | ICD-10-CM | POA: Diagnosis not present

## 2015-05-22 DIAGNOSIS — R0789 Other chest pain: Secondary | ICD-10-CM | POA: Diagnosis not present

## 2015-05-22 DIAGNOSIS — R079 Chest pain, unspecified: Secondary | ICD-10-CM | POA: Diagnosis not present

## 2015-05-22 DIAGNOSIS — E785 Hyperlipidemia, unspecified: Secondary | ICD-10-CM | POA: Diagnosis not present

## 2015-05-22 DIAGNOSIS — E039 Hypothyroidism, unspecified: Secondary | ICD-10-CM | POA: Diagnosis not present

## 2015-05-22 DIAGNOSIS — E668 Other obesity: Secondary | ICD-10-CM | POA: Diagnosis not present

## 2015-05-22 DIAGNOSIS — G4733 Obstructive sleep apnea (adult) (pediatric): Secondary | ICD-10-CM | POA: Diagnosis not present

## 2015-07-06 DIAGNOSIS — R0982 Postnasal drip: Secondary | ICD-10-CM | POA: Diagnosis not present

## 2015-07-06 DIAGNOSIS — J019 Acute sinusitis, unspecified: Secondary | ICD-10-CM | POA: Diagnosis not present

## 2015-07-10 DIAGNOSIS — J209 Acute bronchitis, unspecified: Secondary | ICD-10-CM | POA: Diagnosis not present

## 2015-09-02 ENCOUNTER — Telehealth: Payer: Self-pay | Admitting: Internal Medicine

## 2015-09-02 NOTE — Telephone Encounter (Signed)
Sending mammogram overdue letter..Adriana Franklin

## 2015-09-03 ENCOUNTER — Encounter: Payer: Self-pay | Admitting: Internal Medicine

## 2015-10-19 DIAGNOSIS — E038 Other specified hypothyroidism: Secondary | ICD-10-CM | POA: Diagnosis not present

## 2015-10-19 DIAGNOSIS — E559 Vitamin D deficiency, unspecified: Secondary | ICD-10-CM | POA: Diagnosis not present

## 2015-10-23 DIAGNOSIS — E038 Other specified hypothyroidism: Secondary | ICD-10-CM | POA: Diagnosis not present

## 2015-10-23 DIAGNOSIS — E559 Vitamin D deficiency, unspecified: Secondary | ICD-10-CM | POA: Diagnosis not present

## 2015-12-16 DIAGNOSIS — M791 Myalgia: Secondary | ICD-10-CM | POA: Diagnosis not present

## 2015-12-16 DIAGNOSIS — M9901 Segmental and somatic dysfunction of cervical region: Secondary | ICD-10-CM | POA: Diagnosis not present

## 2015-12-16 DIAGNOSIS — M50322 Other cervical disc degeneration at C5-C6 level: Secondary | ICD-10-CM | POA: Diagnosis not present

## 2015-12-16 DIAGNOSIS — M5384 Other specified dorsopathies, thoracic region: Secondary | ICD-10-CM | POA: Diagnosis not present

## 2015-12-16 DIAGNOSIS — M4003 Postural kyphosis, cervicothoracic region: Secondary | ICD-10-CM | POA: Diagnosis not present

## 2015-12-16 DIAGNOSIS — M9902 Segmental and somatic dysfunction of thoracic region: Secondary | ICD-10-CM | POA: Diagnosis not present

## 2015-12-17 DIAGNOSIS — M791 Myalgia: Secondary | ICD-10-CM | POA: Diagnosis not present

## 2015-12-17 DIAGNOSIS — M5126 Other intervertebral disc displacement, lumbar region: Secondary | ICD-10-CM | POA: Diagnosis not present

## 2015-12-17 DIAGNOSIS — M4003 Postural kyphosis, cervicothoracic region: Secondary | ICD-10-CM | POA: Diagnosis not present

## 2015-12-17 DIAGNOSIS — M9901 Segmental and somatic dysfunction of cervical region: Secondary | ICD-10-CM | POA: Diagnosis not present

## 2015-12-17 DIAGNOSIS — M9903 Segmental and somatic dysfunction of lumbar region: Secondary | ICD-10-CM | POA: Diagnosis not present

## 2015-12-17 DIAGNOSIS — M5384 Other specified dorsopathies, thoracic region: Secondary | ICD-10-CM | POA: Diagnosis not present

## 2015-12-17 DIAGNOSIS — M50322 Other cervical disc degeneration at C5-C6 level: Secondary | ICD-10-CM | POA: Diagnosis not present

## 2015-12-17 DIAGNOSIS — M9902 Segmental and somatic dysfunction of thoracic region: Secondary | ICD-10-CM | POA: Diagnosis not present

## 2015-12-22 DIAGNOSIS — M9903 Segmental and somatic dysfunction of lumbar region: Secondary | ICD-10-CM | POA: Diagnosis not present

## 2015-12-22 DIAGNOSIS — M791 Myalgia: Secondary | ICD-10-CM | POA: Diagnosis not present

## 2015-12-22 DIAGNOSIS — M5126 Other intervertebral disc displacement, lumbar region: Secondary | ICD-10-CM | POA: Diagnosis not present

## 2015-12-22 DIAGNOSIS — M9901 Segmental and somatic dysfunction of cervical region: Secondary | ICD-10-CM | POA: Diagnosis not present

## 2015-12-22 DIAGNOSIS — M9902 Segmental and somatic dysfunction of thoracic region: Secondary | ICD-10-CM | POA: Diagnosis not present

## 2015-12-22 DIAGNOSIS — M4003 Postural kyphosis, cervicothoracic region: Secondary | ICD-10-CM | POA: Diagnosis not present

## 2015-12-22 DIAGNOSIS — M5384 Other specified dorsopathies, thoracic region: Secondary | ICD-10-CM | POA: Diagnosis not present

## 2015-12-22 DIAGNOSIS — M50322 Other cervical disc degeneration at C5-C6 level: Secondary | ICD-10-CM | POA: Diagnosis not present

## 2015-12-24 DIAGNOSIS — M791 Myalgia: Secondary | ICD-10-CM | POA: Diagnosis not present

## 2015-12-24 DIAGNOSIS — M9902 Segmental and somatic dysfunction of thoracic region: Secondary | ICD-10-CM | POA: Diagnosis not present

## 2015-12-24 DIAGNOSIS — M50322 Other cervical disc degeneration at C5-C6 level: Secondary | ICD-10-CM | POA: Diagnosis not present

## 2015-12-24 DIAGNOSIS — M4003 Postural kyphosis, cervicothoracic region: Secondary | ICD-10-CM | POA: Diagnosis not present

## 2015-12-24 DIAGNOSIS — M5126 Other intervertebral disc displacement, lumbar region: Secondary | ICD-10-CM | POA: Diagnosis not present

## 2015-12-24 DIAGNOSIS — M9903 Segmental and somatic dysfunction of lumbar region: Secondary | ICD-10-CM | POA: Diagnosis not present

## 2015-12-24 DIAGNOSIS — M9901 Segmental and somatic dysfunction of cervical region: Secondary | ICD-10-CM | POA: Diagnosis not present

## 2015-12-24 DIAGNOSIS — M5384 Other specified dorsopathies, thoracic region: Secondary | ICD-10-CM | POA: Diagnosis not present

## 2015-12-29 DIAGNOSIS — M4003 Postural kyphosis, cervicothoracic region: Secondary | ICD-10-CM | POA: Diagnosis not present

## 2015-12-29 DIAGNOSIS — M791 Myalgia: Secondary | ICD-10-CM | POA: Diagnosis not present

## 2015-12-29 DIAGNOSIS — M5384 Other specified dorsopathies, thoracic region: Secondary | ICD-10-CM | POA: Diagnosis not present

## 2015-12-29 DIAGNOSIS — M5126 Other intervertebral disc displacement, lumbar region: Secondary | ICD-10-CM | POA: Diagnosis not present

## 2015-12-29 DIAGNOSIS — M9903 Segmental and somatic dysfunction of lumbar region: Secondary | ICD-10-CM | POA: Diagnosis not present

## 2015-12-29 DIAGNOSIS — M9901 Segmental and somatic dysfunction of cervical region: Secondary | ICD-10-CM | POA: Diagnosis not present

## 2015-12-29 DIAGNOSIS — M50322 Other cervical disc degeneration at C5-C6 level: Secondary | ICD-10-CM | POA: Diagnosis not present

## 2015-12-29 DIAGNOSIS — M9902 Segmental and somatic dysfunction of thoracic region: Secondary | ICD-10-CM | POA: Diagnosis not present

## 2015-12-31 DIAGNOSIS — M5384 Other specified dorsopathies, thoracic region: Secondary | ICD-10-CM | POA: Diagnosis not present

## 2015-12-31 DIAGNOSIS — M50322 Other cervical disc degeneration at C5-C6 level: Secondary | ICD-10-CM | POA: Diagnosis not present

## 2015-12-31 DIAGNOSIS — M791 Myalgia: Secondary | ICD-10-CM | POA: Diagnosis not present

## 2015-12-31 DIAGNOSIS — M4003 Postural kyphosis, cervicothoracic region: Secondary | ICD-10-CM | POA: Diagnosis not present

## 2015-12-31 DIAGNOSIS — M9901 Segmental and somatic dysfunction of cervical region: Secondary | ICD-10-CM | POA: Diagnosis not present

## 2015-12-31 DIAGNOSIS — M9903 Segmental and somatic dysfunction of lumbar region: Secondary | ICD-10-CM | POA: Diagnosis not present

## 2015-12-31 DIAGNOSIS — M5126 Other intervertebral disc displacement, lumbar region: Secondary | ICD-10-CM | POA: Diagnosis not present

## 2015-12-31 DIAGNOSIS — M9902 Segmental and somatic dysfunction of thoracic region: Secondary | ICD-10-CM | POA: Diagnosis not present

## 2016-01-04 DIAGNOSIS — H25011 Cortical age-related cataract, right eye: Secondary | ICD-10-CM | POA: Diagnosis not present

## 2016-01-04 DIAGNOSIS — H25041 Posterior subcapsular polar age-related cataract, right eye: Secondary | ICD-10-CM | POA: Diagnosis not present

## 2016-01-04 DIAGNOSIS — H2511 Age-related nuclear cataract, right eye: Secondary | ICD-10-CM | POA: Diagnosis not present

## 2016-01-04 DIAGNOSIS — H25042 Posterior subcapsular polar age-related cataract, left eye: Secondary | ICD-10-CM | POA: Diagnosis not present

## 2016-01-04 DIAGNOSIS — H16222 Keratoconjunctivitis sicca, not specified as Sjogren's, left eye: Secondary | ICD-10-CM | POA: Diagnosis not present

## 2016-01-04 DIAGNOSIS — H2512 Age-related nuclear cataract, left eye: Secondary | ICD-10-CM | POA: Diagnosis not present

## 2016-01-04 DIAGNOSIS — H25012 Cortical age-related cataract, left eye: Secondary | ICD-10-CM | POA: Diagnosis not present

## 2016-01-05 DIAGNOSIS — M9901 Segmental and somatic dysfunction of cervical region: Secondary | ICD-10-CM | POA: Diagnosis not present

## 2016-01-05 DIAGNOSIS — M5126 Other intervertebral disc displacement, lumbar region: Secondary | ICD-10-CM | POA: Diagnosis not present

## 2016-01-05 DIAGNOSIS — M4003 Postural kyphosis, cervicothoracic region: Secondary | ICD-10-CM | POA: Diagnosis not present

## 2016-01-05 DIAGNOSIS — M9902 Segmental and somatic dysfunction of thoracic region: Secondary | ICD-10-CM | POA: Diagnosis not present

## 2016-01-05 DIAGNOSIS — M791 Myalgia: Secondary | ICD-10-CM | POA: Diagnosis not present

## 2016-01-05 DIAGNOSIS — M5384 Other specified dorsopathies, thoracic region: Secondary | ICD-10-CM | POA: Diagnosis not present

## 2016-01-05 DIAGNOSIS — M9903 Segmental and somatic dysfunction of lumbar region: Secondary | ICD-10-CM | POA: Diagnosis not present

## 2016-01-05 DIAGNOSIS — M50322 Other cervical disc degeneration at C5-C6 level: Secondary | ICD-10-CM | POA: Diagnosis not present

## 2016-01-07 DIAGNOSIS — M5384 Other specified dorsopathies, thoracic region: Secondary | ICD-10-CM | POA: Diagnosis not present

## 2016-01-07 DIAGNOSIS — M4003 Postural kyphosis, cervicothoracic region: Secondary | ICD-10-CM | POA: Diagnosis not present

## 2016-01-07 DIAGNOSIS — M9903 Segmental and somatic dysfunction of lumbar region: Secondary | ICD-10-CM | POA: Diagnosis not present

## 2016-01-07 DIAGNOSIS — M9901 Segmental and somatic dysfunction of cervical region: Secondary | ICD-10-CM | POA: Diagnosis not present

## 2016-01-07 DIAGNOSIS — M50322 Other cervical disc degeneration at C5-C6 level: Secondary | ICD-10-CM | POA: Diagnosis not present

## 2016-01-07 DIAGNOSIS — M9902 Segmental and somatic dysfunction of thoracic region: Secondary | ICD-10-CM | POA: Diagnosis not present

## 2016-01-07 DIAGNOSIS — M5126 Other intervertebral disc displacement, lumbar region: Secondary | ICD-10-CM | POA: Diagnosis not present

## 2016-01-07 DIAGNOSIS — M791 Myalgia: Secondary | ICD-10-CM | POA: Diagnosis not present

## 2016-01-21 DIAGNOSIS — M9903 Segmental and somatic dysfunction of lumbar region: Secondary | ICD-10-CM | POA: Diagnosis not present

## 2016-01-21 DIAGNOSIS — M5384 Other specified dorsopathies, thoracic region: Secondary | ICD-10-CM | POA: Diagnosis not present

## 2016-01-21 DIAGNOSIS — M9901 Segmental and somatic dysfunction of cervical region: Secondary | ICD-10-CM | POA: Diagnosis not present

## 2016-01-21 DIAGNOSIS — M791 Myalgia: Secondary | ICD-10-CM | POA: Diagnosis not present

## 2016-01-21 DIAGNOSIS — M4003 Postural kyphosis, cervicothoracic region: Secondary | ICD-10-CM | POA: Diagnosis not present

## 2016-01-21 DIAGNOSIS — M50322 Other cervical disc degeneration at C5-C6 level: Secondary | ICD-10-CM | POA: Diagnosis not present

## 2016-01-21 DIAGNOSIS — M9902 Segmental and somatic dysfunction of thoracic region: Secondary | ICD-10-CM | POA: Diagnosis not present

## 2016-01-21 DIAGNOSIS — M5126 Other intervertebral disc displacement, lumbar region: Secondary | ICD-10-CM | POA: Diagnosis not present

## 2016-01-22 DIAGNOSIS — H5203 Hypermetropia, bilateral: Secondary | ICD-10-CM | POA: Diagnosis not present

## 2016-01-22 DIAGNOSIS — H2513 Age-related nuclear cataract, bilateral: Secondary | ICD-10-CM | POA: Diagnosis not present

## 2016-01-22 DIAGNOSIS — H524 Presbyopia: Secondary | ICD-10-CM | POA: Diagnosis not present

## 2016-02-02 DIAGNOSIS — M791 Myalgia: Secondary | ICD-10-CM | POA: Diagnosis not present

## 2016-02-02 DIAGNOSIS — M9903 Segmental and somatic dysfunction of lumbar region: Secondary | ICD-10-CM | POA: Diagnosis not present

## 2016-02-02 DIAGNOSIS — M5384 Other specified dorsopathies, thoracic region: Secondary | ICD-10-CM | POA: Diagnosis not present

## 2016-02-02 DIAGNOSIS — M9901 Segmental and somatic dysfunction of cervical region: Secondary | ICD-10-CM | POA: Diagnosis not present

## 2016-02-02 DIAGNOSIS — M4003 Postural kyphosis, cervicothoracic region: Secondary | ICD-10-CM | POA: Diagnosis not present

## 2016-02-02 DIAGNOSIS — M5126 Other intervertebral disc displacement, lumbar region: Secondary | ICD-10-CM | POA: Diagnosis not present

## 2016-02-02 DIAGNOSIS — M50322 Other cervical disc degeneration at C5-C6 level: Secondary | ICD-10-CM | POA: Diagnosis not present

## 2016-02-02 DIAGNOSIS — M9902 Segmental and somatic dysfunction of thoracic region: Secondary | ICD-10-CM | POA: Diagnosis not present

## 2016-02-16 DIAGNOSIS — M9901 Segmental and somatic dysfunction of cervical region: Secondary | ICD-10-CM | POA: Diagnosis not present

## 2016-02-16 DIAGNOSIS — M50322 Other cervical disc degeneration at C5-C6 level: Secondary | ICD-10-CM | POA: Diagnosis not present

## 2016-02-16 DIAGNOSIS — M4003 Postural kyphosis, cervicothoracic region: Secondary | ICD-10-CM | POA: Diagnosis not present

## 2016-02-16 DIAGNOSIS — M5384 Other specified dorsopathies, thoracic region: Secondary | ICD-10-CM | POA: Diagnosis not present

## 2016-02-16 DIAGNOSIS — M5126 Other intervertebral disc displacement, lumbar region: Secondary | ICD-10-CM | POA: Diagnosis not present

## 2016-02-16 DIAGNOSIS — M791 Myalgia: Secondary | ICD-10-CM | POA: Diagnosis not present

## 2016-02-16 DIAGNOSIS — M9903 Segmental and somatic dysfunction of lumbar region: Secondary | ICD-10-CM | POA: Diagnosis not present

## 2016-02-16 DIAGNOSIS — M9902 Segmental and somatic dysfunction of thoracic region: Secondary | ICD-10-CM | POA: Diagnosis not present

## 2016-04-18 DIAGNOSIS — E559 Vitamin D deficiency, unspecified: Secondary | ICD-10-CM | POA: Diagnosis not present

## 2016-04-18 DIAGNOSIS — E038 Other specified hypothyroidism: Secondary | ICD-10-CM | POA: Diagnosis not present

## 2016-04-22 DIAGNOSIS — E559 Vitamin D deficiency, unspecified: Secondary | ICD-10-CM | POA: Diagnosis not present

## 2016-04-22 DIAGNOSIS — E038 Other specified hypothyroidism: Secondary | ICD-10-CM | POA: Diagnosis not present

## 2016-06-23 DIAGNOSIS — E559 Vitamin D deficiency, unspecified: Secondary | ICD-10-CM | POA: Diagnosis not present

## 2016-06-23 DIAGNOSIS — E038 Other specified hypothyroidism: Secondary | ICD-10-CM | POA: Diagnosis not present

## 2016-06-24 DIAGNOSIS — E038 Other specified hypothyroidism: Secondary | ICD-10-CM | POA: Diagnosis not present

## 2016-06-24 DIAGNOSIS — E559 Vitamin D deficiency, unspecified: Secondary | ICD-10-CM | POA: Diagnosis not present

## 2016-07-07 ENCOUNTER — Encounter (HOSPITAL_COMMUNITY): Payer: Self-pay | Admitting: Family Medicine

## 2016-07-07 ENCOUNTER — Ambulatory Visit (HOSPITAL_COMMUNITY)
Admission: EM | Admit: 2016-07-07 | Discharge: 2016-07-07 | Disposition: A | Discharge status: Home / Self Care | Payer: Medicare HMO | Attending: Emergency Medicine | Admitting: Emergency Medicine

## 2016-07-07 DIAGNOSIS — J9801 Acute bronchospasm: Secondary | ICD-10-CM

## 2016-07-07 DIAGNOSIS — R059 Cough, unspecified: Secondary | ICD-10-CM

## 2016-07-07 DIAGNOSIS — J069 Acute upper respiratory infection, unspecified: Secondary | ICD-10-CM | POA: Diagnosis not present

## 2016-07-07 DIAGNOSIS — R0982 Postnasal drip: Secondary | ICD-10-CM

## 2016-07-07 DIAGNOSIS — R05 Cough: Secondary | ICD-10-CM

## 2016-07-07 MED ORDER — PREDNISONE 20 MG PO TABS: ORAL_TABLET | ORAL | 0 refills | Status: AC

## 2016-07-07 MED ORDER — ALBUTEROL SULFATE HFA 108 (90 BASE) MCG/ACT IN AERS: 2.0000 | INHALATION_SPRAY | RESPIRATORY_TRACT | 0 refills | Status: AC | PRN

## 2016-07-07 NOTE — ED Triage Notes (Signed)
Pt here for sinus congestion and runny nose x 1 week with clear mucous. sts cough with green mucous.

## 2016-07-07 NOTE — ED Provider Notes (Signed)
CSN: [id number]     Arrival date & time 07/07/16  1150 History   First MD Initiated Contact with Patient 07/07/16 1215     Chief Complaint  Patient presents with  . Cough   (Consider location/radiation/quality/duration/timing/severity/associated sxs/prior Treatment) 75 year old female presents with a cough for one week. She states that she has minor shortness of breath and sore muscles. Occasionally has coughing spasms. She apparently stopped smoking 30 years ago but had been smoking for a few years before that. Occasionally has earaches and a lot of PND. Denies asthma or current smoking. Denies fever or chills.      Past Medical History:  Diagnosis Date  . Allergy    itching  . Anemia   . Arthralgia   . Arthritis   . Depression   . Fatigue   . Hashimoto's disease   . Hearing loss   . Hernia   . Hyperlipidemia   . Muscle cramps   . Muscle pain   . Obesity   . Raynaud's syndrome   . Shortness of breath   . Sleep apnea   . Vertigo    Past Surgical History:  Procedure Laterality Date  . APPENDECTOMY    . KNEE ARTHROSCOPY Left   . [place]  . THROAT SURGERY  1992   for sleep apnea treatment  . TONSILLECTOMY     Family History  Problem Relation Age of Onset  . Hypertension Mother   . Heart disease Father   . Learning disabilities Sister    Social History  Substance Use Topics  . Smoking status: Former Smoker    Packs/day: 2.00    Years: 10.00    Types: Cigarettes    Quit date: 07/28/1984  . Smokeless tobacco: Never Used  . Alcohol use No   OB History    No data available     Review of Systems  Constitutional: Negative.   HENT: Positive for congestion and postnasal drip. Negative for sore throat.   Respiratory: Positive for cough.        Mild shortness of breath.  Cardiovascular: Negative for chest pain and leg swelling.  Gastrointestinal: Negative.   Musculoskeletal: Positive for myalgias.  Skin: Negative.   Neurological: Negative.   All  other systems reviewed and are negative.   Allergies  Onion  Home Medications   Prior to Admission medications   Medication Sig Start Date End Date Taking? Authorizing Provider  albuterol (PROVENTIL HFA;VENTOLIN HFA) 108 (90 Base) MCG/ACT inhaler Inhale 2 puffs into the lungs every 4 (four) hours as needed for wheezing or shortness of breath. 07/07/16   Janne Napoleon, NP  AMBULATORY NON FORMULARY MEDICATION Medication Name: Ultimate H.A supplement: 2 by mouth daily    Historical Provider, MD  AMBULATORY NON FORMULARY MEDICATION Medication Name: AstaFX- 2 by mouth daily    Historical Provider, MD  aspirin 81 MG tablet Take 81 mg by mouth daily.    Historical Provider, MD  Cholecalciferol (VITAMIN D3) 5000 UNITS CAPS Take by mouth daily.    Historical Provider, MD  Ferrous Gluconate (IRON) 240 (27 FE) MG TABS Take by mouth daily.    Historical Provider, MD  hydrochlorothiazide (HYDRODIURIL) 25 MG tablet Take 1 tablet (25 mg total) by mouth as needed (For Swelling). 01/28/15   Gildardo Cranker, DO  levothyroxine (SYNTHROID, LEVOTHROID) 125 MCG tablet Take 125 mcg by mouth daily before breakfast.     Historical Provider, MD  omega-3 acid ethyl esters (LOVAZA) 1 G capsule Take 1  g by mouth daily.    Historical Provider, MD  predniSONE (DELTASONE) 20 MG tablet Take 2 tablets daily X 6 days. Take with food. 07/07/16   Janne Napoleon, NP   Meds Ordered and Administered this Visit  Medications - No data to display  BP 150/78 (BP Location: Left Arm)   Pulse 97   Temp 99 F (37.2 C) (Oral)   Resp 16   SpO2 97%  No data found.   Physical Exam  Constitutional: She is oriented to person, place, and time. She appears well-developed and well-nourished. No distress.  HENT:  Right Ear: External ear normal.  Left Ear: External ear normal.  Oropharynx with minor erythema, cobblestoning and light clear PND.  Eyes: EOM are normal. Pupils are equal, round, and reactive to light.  Neck: Normal range of motion.  Neck supple.  Cardiovascular: Normal rate, regular rhythm and normal heart sounds.   Pulmonary/Chest: Effort normal. No respiratory distress.  With tidal volume lungs are clear. With deeper inspiration and forced expiration and cough there is bilateral diffuse coarseness and light distant wheezing. Deep inspiration produces cough spasms.  Musculoskeletal: Normal range of motion. She exhibits no edema.  Lymphadenopathy:    She has no cervical adenopathy.  Neurological: She is alert and oriented to person, place, and time.  Skin: Skin is warm and dry.  Psychiatric: She has a normal mood and affect.  Nursing note and vitals reviewed.   Urgent Care Course   Clinical Course     Procedures (including critical care time)  Labs Review Labs Reviewed - No data to display  Imaging Review No results found.   Visual Acuity Review  Right Eye Distance:   Left Eye Distance:   Bilateral Distance:    Right Eye Near:   Left Eye Near:    Bilateral Near:         MDM   1. Cough   2. Acute upper respiratory infection   3. Bronchospasm   4. PND (post-nasal drip)    Your cough is due to at least 2 separate conditions. One of them is drainage in the back of your throat. For nondrowsy relief recommend taking Allegra. You may add or substitute Chlor-Trimeton 4 mg tablets every 4-6 hours as needed for drainage and runny nose. This will cause drowsiness. If this causes too much drowsiness he may break the tablet in half and take half a tablet every 4-6 hours. This can help you sleep. The other condition you have is wheezing. His also called bronchospasm. Please review the instruction sheet accompanying your papers. This is best treated with using the albuterol inhaler 2 puffs every 4 hours as needed for cough and wheeze and taking the prednisone as directed for 6 days. If you develop increased shortness of breath, increased cough, fever, chills or getting worse sick medical attention promptly even  if you have to go to the emergency department. This could indicate pneumonia or other serious infection. Drink plenty fluids and stay well-hydrated. May also use saline nasal spray frequently. Meds ordered this encounter  Medications  . albuterol (PROVENTIL HFA;VENTOLIN HFA) 108 (90 Base) MCG/ACT inhaler    Sig: Inhale 2 puffs into the lungs every 4 (four) hours as needed for wheezing or shortness of breath.    Dispense:  1 Inhaler    Refill:  0    Order Specific Question:   Supervising Provider    Answer:   Melony Overly [id number]  . predniSONE (DELTASONE) 20 MG tablet  Sig: Take 2 tablets daily X 6 days. Take with food.    Dispense:  12 tablet    Refill:  0    Order Specific Question:   Supervising Provider    Answer:   Carmela Hurt       Janne Napoleon, NP 07/07/16 1254

## 2016-07-07 NOTE — Discharge Instructions (Signed)
Your cough is due to at least 2 separate conditions. One of them is drainage in the back of your throat. For nondrowsy relief recommend taking Allegra. You may add or substitute Chlor-Trimeton 4 mg tablets every 4-6 hours as needed for drainage and runny nose. This will cause drowsiness. If this causes too much drowsiness he may break the tablet in half and take half a tablet every 4-6 hours. This can help you sleep. The other condition you have is wheezing. His also called bronchospasm. Please review the instruction sheet accompanying your papers. This is best treated with using the albuterol inhaler 2 puffs every 4 hours as needed for cough and wheeze and taking the prednisone as directed for 6 days. If you develop increased shortness of breath, increased cough, fever, chills or getting worse sick medical attention promptly even if you have to go to the emergency department. This could indicate pneumonia or other serious infection. Drink plenty fluids and stay well-hydrated. May also use saline nasal spray frequently.

## 2016-08-31 ENCOUNTER — Telehealth: Payer: Self-pay

## 2016-08-31 NOTE — Telephone Encounter (Signed)
Called and spoke with Adriana Franklin about scheduling an appointment. She said she was currently seeing another provider at a different clinic and was no longer interested in seeing provider here. I have updated system to reflect this change.

## 2016-11-07 DIAGNOSIS — M5384 Other specified dorsopathies, thoracic region: Secondary | ICD-10-CM | POA: Diagnosis not present

## 2016-11-07 DIAGNOSIS — M4003 Postural kyphosis, cervicothoracic region: Secondary | ICD-10-CM | POA: Diagnosis not present

## 2016-11-07 DIAGNOSIS — M50322 Other cervical disc degeneration at C5-C6 level: Secondary | ICD-10-CM | POA: Diagnosis not present

## 2016-11-07 DIAGNOSIS — M9901 Segmental and somatic dysfunction of cervical region: Secondary | ICD-10-CM | POA: Diagnosis not present

## 2016-11-07 DIAGNOSIS — M5126 Other intervertebral disc displacement, lumbar region: Secondary | ICD-10-CM | POA: Diagnosis not present

## 2016-11-07 DIAGNOSIS — M9902 Segmental and somatic dysfunction of thoracic region: Secondary | ICD-10-CM | POA: Diagnosis not present

## 2016-11-07 DIAGNOSIS — M791 Myalgia: Secondary | ICD-10-CM | POA: Diagnosis not present

## 2016-11-07 DIAGNOSIS — M9903 Segmental and somatic dysfunction of lumbar region: Secondary | ICD-10-CM | POA: Diagnosis not present

## 2016-11-22 DIAGNOSIS — M791 Myalgia: Secondary | ICD-10-CM | POA: Diagnosis not present

## 2016-11-22 DIAGNOSIS — M5384 Other specified dorsopathies, thoracic region: Secondary | ICD-10-CM | POA: Diagnosis not present

## 2016-11-22 DIAGNOSIS — M50322 Other cervical disc degeneration at C5-C6 level: Secondary | ICD-10-CM | POA: Diagnosis not present

## 2016-11-22 DIAGNOSIS — M9901 Segmental and somatic dysfunction of cervical region: Secondary | ICD-10-CM | POA: Diagnosis not present

## 2016-11-22 DIAGNOSIS — M5126 Other intervertebral disc displacement, lumbar region: Secondary | ICD-10-CM | POA: Diagnosis not present

## 2016-11-22 DIAGNOSIS — M9902 Segmental and somatic dysfunction of thoracic region: Secondary | ICD-10-CM | POA: Diagnosis not present

## 2016-11-22 DIAGNOSIS — M9903 Segmental and somatic dysfunction of lumbar region: Secondary | ICD-10-CM | POA: Diagnosis not present

## 2016-11-22 DIAGNOSIS — M4003 Postural kyphosis, cervicothoracic region: Secondary | ICD-10-CM | POA: Diagnosis not present

## 2016-11-29 DIAGNOSIS — M9903 Segmental and somatic dysfunction of lumbar region: Secondary | ICD-10-CM | POA: Diagnosis not present

## 2016-11-29 DIAGNOSIS — M9902 Segmental and somatic dysfunction of thoracic region: Secondary | ICD-10-CM | POA: Diagnosis not present

## 2016-11-29 DIAGNOSIS — M5384 Other specified dorsopathies, thoracic region: Secondary | ICD-10-CM | POA: Diagnosis not present

## 2016-11-29 DIAGNOSIS — M791 Myalgia: Secondary | ICD-10-CM | POA: Diagnosis not present

## 2016-11-29 DIAGNOSIS — M5126 Other intervertebral disc displacement, lumbar region: Secondary | ICD-10-CM | POA: Diagnosis not present

## 2016-11-29 DIAGNOSIS — M4003 Postural kyphosis, cervicothoracic region: Secondary | ICD-10-CM | POA: Diagnosis not present

## 2016-11-29 DIAGNOSIS — M50322 Other cervical disc degeneration at C5-C6 level: Secondary | ICD-10-CM | POA: Diagnosis not present

## 2016-11-29 DIAGNOSIS — M9901 Segmental and somatic dysfunction of cervical region: Secondary | ICD-10-CM | POA: Diagnosis not present

## 2016-12-26 DIAGNOSIS — M9903 Segmental and somatic dysfunction of lumbar region: Secondary | ICD-10-CM | POA: Diagnosis not present

## 2016-12-26 DIAGNOSIS — M791 Myalgia: Secondary | ICD-10-CM | POA: Diagnosis not present

## 2016-12-26 DIAGNOSIS — M5126 Other intervertebral disc displacement, lumbar region: Secondary | ICD-10-CM | POA: Diagnosis not present

## 2016-12-26 DIAGNOSIS — M5384 Other specified dorsopathies, thoracic region: Secondary | ICD-10-CM | POA: Diagnosis not present

## 2016-12-26 DIAGNOSIS — M9901 Segmental and somatic dysfunction of cervical region: Secondary | ICD-10-CM | POA: Diagnosis not present

## 2016-12-26 DIAGNOSIS — M4003 Postural kyphosis, cervicothoracic region: Secondary | ICD-10-CM | POA: Diagnosis not present

## 2016-12-26 DIAGNOSIS — M50322 Other cervical disc degeneration at C5-C6 level: Secondary | ICD-10-CM | POA: Diagnosis not present

## 2016-12-26 DIAGNOSIS — M9902 Segmental and somatic dysfunction of thoracic region: Secondary | ICD-10-CM | POA: Diagnosis not present

## 2016-12-28 DIAGNOSIS — M9902 Segmental and somatic dysfunction of thoracic region: Secondary | ICD-10-CM | POA: Diagnosis not present

## 2016-12-28 DIAGNOSIS — M50322 Other cervical disc degeneration at C5-C6 level: Secondary | ICD-10-CM | POA: Diagnosis not present

## 2016-12-28 DIAGNOSIS — M5126 Other intervertebral disc displacement, lumbar region: Secondary | ICD-10-CM | POA: Diagnosis not present

## 2016-12-28 DIAGNOSIS — M4003 Postural kyphosis, cervicothoracic region: Secondary | ICD-10-CM | POA: Diagnosis not present

## 2016-12-28 DIAGNOSIS — M9901 Segmental and somatic dysfunction of cervical region: Secondary | ICD-10-CM | POA: Diagnosis not present

## 2016-12-28 DIAGNOSIS — M9903 Segmental and somatic dysfunction of lumbar region: Secondary | ICD-10-CM | POA: Diagnosis not present

## 2016-12-28 DIAGNOSIS — M5384 Other specified dorsopathies, thoracic region: Secondary | ICD-10-CM | POA: Diagnosis not present

## 2016-12-28 DIAGNOSIS — M791 Myalgia: Secondary | ICD-10-CM | POA: Diagnosis not present

## 2016-12-30 DIAGNOSIS — M9903 Segmental and somatic dysfunction of lumbar region: Secondary | ICD-10-CM | POA: Diagnosis not present

## 2016-12-30 DIAGNOSIS — M5126 Other intervertebral disc displacement, lumbar region: Secondary | ICD-10-CM | POA: Diagnosis not present

## 2016-12-30 DIAGNOSIS — M50322 Other cervical disc degeneration at C5-C6 level: Secondary | ICD-10-CM | POA: Diagnosis not present

## 2016-12-30 DIAGNOSIS — M9901 Segmental and somatic dysfunction of cervical region: Secondary | ICD-10-CM | POA: Diagnosis not present

## 2016-12-30 DIAGNOSIS — M4003 Postural kyphosis, cervicothoracic region: Secondary | ICD-10-CM | POA: Diagnosis not present

## 2016-12-30 DIAGNOSIS — M5384 Other specified dorsopathies, thoracic region: Secondary | ICD-10-CM | POA: Diagnosis not present

## 2016-12-30 DIAGNOSIS — M9902 Segmental and somatic dysfunction of thoracic region: Secondary | ICD-10-CM | POA: Diagnosis not present

## 2016-12-30 DIAGNOSIS — M791 Myalgia: Secondary | ICD-10-CM | POA: Diagnosis not present

## 2017-01-02 DIAGNOSIS — M5384 Other specified dorsopathies, thoracic region: Secondary | ICD-10-CM | POA: Diagnosis not present

## 2017-01-02 DIAGNOSIS — M9902 Segmental and somatic dysfunction of thoracic region: Secondary | ICD-10-CM | POA: Diagnosis not present

## 2017-01-02 DIAGNOSIS — M9901 Segmental and somatic dysfunction of cervical region: Secondary | ICD-10-CM | POA: Diagnosis not present

## 2017-01-02 DIAGNOSIS — M791 Myalgia: Secondary | ICD-10-CM | POA: Diagnosis not present

## 2017-01-02 DIAGNOSIS — M50322 Other cervical disc degeneration at C5-C6 level: Secondary | ICD-10-CM | POA: Diagnosis not present

## 2017-01-02 DIAGNOSIS — M4003 Postural kyphosis, cervicothoracic region: Secondary | ICD-10-CM | POA: Diagnosis not present

## 2017-01-02 DIAGNOSIS — M9903 Segmental and somatic dysfunction of lumbar region: Secondary | ICD-10-CM | POA: Diagnosis not present

## 2017-01-02 DIAGNOSIS — M5126 Other intervertebral disc displacement, lumbar region: Secondary | ICD-10-CM | POA: Diagnosis not present

## 2017-01-04 DIAGNOSIS — M5126 Other intervertebral disc displacement, lumbar region: Secondary | ICD-10-CM | POA: Diagnosis not present

## 2017-01-04 DIAGNOSIS — M791 Myalgia: Secondary | ICD-10-CM | POA: Diagnosis not present

## 2017-01-04 DIAGNOSIS — M9903 Segmental and somatic dysfunction of lumbar region: Secondary | ICD-10-CM | POA: Diagnosis not present

## 2017-01-04 DIAGNOSIS — M50322 Other cervical disc degeneration at C5-C6 level: Secondary | ICD-10-CM | POA: Diagnosis not present

## 2017-01-04 DIAGNOSIS — M4003 Postural kyphosis, cervicothoracic region: Secondary | ICD-10-CM | POA: Diagnosis not present

## 2017-01-04 DIAGNOSIS — M9901 Segmental and somatic dysfunction of cervical region: Secondary | ICD-10-CM | POA: Diagnosis not present

## 2017-01-04 DIAGNOSIS — M9902 Segmental and somatic dysfunction of thoracic region: Secondary | ICD-10-CM | POA: Diagnosis not present

## 2017-01-04 DIAGNOSIS — M5384 Other specified dorsopathies, thoracic region: Secondary | ICD-10-CM | POA: Diagnosis not present

## 2017-01-13 DIAGNOSIS — E039 Hypothyroidism, unspecified: Secondary | ICD-10-CM | POA: Diagnosis not present

## 2017-01-13 DIAGNOSIS — Z0000 Encounter for general adult medical examination without abnormal findings: Secondary | ICD-10-CM | POA: Diagnosis not present

## 2017-01-13 DIAGNOSIS — D333 Benign neoplasm of cranial nerves: Secondary | ICD-10-CM | POA: Diagnosis not present

## 2017-01-13 DIAGNOSIS — Z131 Encounter for screening for diabetes mellitus: Secondary | ICD-10-CM | POA: Diagnosis not present

## 2017-01-13 DIAGNOSIS — Z136 Encounter for screening for cardiovascular disorders: Secondary | ICD-10-CM | POA: Diagnosis not present

## 2017-01-13 DIAGNOSIS — K649 Unspecified hemorrhoids: Secondary | ICD-10-CM | POA: Diagnosis not present

## 2017-01-19 DIAGNOSIS — E559 Vitamin D deficiency, unspecified: Secondary | ICD-10-CM | POA: Diagnosis not present

## 2017-01-19 DIAGNOSIS — E039 Hypothyroidism, unspecified: Secondary | ICD-10-CM | POA: Diagnosis not present

## 2017-03-30 DIAGNOSIS — R69 Illness, unspecified: Secondary | ICD-10-CM | POA: Diagnosis not present

## 2017-07-27 DIAGNOSIS — M9903 Segmental and somatic dysfunction of lumbar region: Secondary | ICD-10-CM | POA: Diagnosis not present

## 2017-07-27 DIAGNOSIS — M5126 Other intervertebral disc displacement, lumbar region: Secondary | ICD-10-CM | POA: Diagnosis not present

## 2017-07-27 DIAGNOSIS — M50322 Other cervical disc degeneration at C5-C6 level: Secondary | ICD-10-CM | POA: Diagnosis not present

## 2017-07-27 DIAGNOSIS — M5384 Other specified dorsopathies, thoracic region: Secondary | ICD-10-CM | POA: Diagnosis not present

## 2017-07-27 DIAGNOSIS — M9901 Segmental and somatic dysfunction of cervical region: Secondary | ICD-10-CM | POA: Diagnosis not present

## 2017-07-27 DIAGNOSIS — M9902 Segmental and somatic dysfunction of thoracic region: Secondary | ICD-10-CM | POA: Diagnosis not present

## 2017-07-27 DIAGNOSIS — M4003 Postural kyphosis, cervicothoracic region: Secondary | ICD-10-CM | POA: Diagnosis not present

## 2017-07-28 DIAGNOSIS — M5126 Other intervertebral disc displacement, lumbar region: Secondary | ICD-10-CM | POA: Diagnosis not present

## 2017-07-28 DIAGNOSIS — M50322 Other cervical disc degeneration at C5-C6 level: Secondary | ICD-10-CM | POA: Diagnosis not present

## 2017-07-28 DIAGNOSIS — M9901 Segmental and somatic dysfunction of cervical region: Secondary | ICD-10-CM | POA: Diagnosis not present

## 2017-07-28 DIAGNOSIS — M4003 Postural kyphosis, cervicothoracic region: Secondary | ICD-10-CM | POA: Diagnosis not present

## 2017-07-28 DIAGNOSIS — M9902 Segmental and somatic dysfunction of thoracic region: Secondary | ICD-10-CM | POA: Diagnosis not present

## 2017-07-28 DIAGNOSIS — M5384 Other specified dorsopathies, thoracic region: Secondary | ICD-10-CM | POA: Diagnosis not present

## 2017-07-28 DIAGNOSIS — M9903 Segmental and somatic dysfunction of lumbar region: Secondary | ICD-10-CM | POA: Diagnosis not present

## 2017-07-31 DIAGNOSIS — M9903 Segmental and somatic dysfunction of lumbar region: Secondary | ICD-10-CM | POA: Diagnosis not present

## 2017-07-31 DIAGNOSIS — M5126 Other intervertebral disc displacement, lumbar region: Secondary | ICD-10-CM | POA: Diagnosis not present

## 2017-07-31 DIAGNOSIS — M50322 Other cervical disc degeneration at C5-C6 level: Secondary | ICD-10-CM | POA: Diagnosis not present

## 2017-07-31 DIAGNOSIS — M9901 Segmental and somatic dysfunction of cervical region: Secondary | ICD-10-CM | POA: Diagnosis not present

## 2017-08-01 DIAGNOSIS — M9903 Segmental and somatic dysfunction of lumbar region: Secondary | ICD-10-CM | POA: Diagnosis not present

## 2017-08-01 DIAGNOSIS — M9901 Segmental and somatic dysfunction of cervical region: Secondary | ICD-10-CM | POA: Diagnosis not present

## 2017-08-01 DIAGNOSIS — M50322 Other cervical disc degeneration at C5-C6 level: Secondary | ICD-10-CM | POA: Diagnosis not present

## 2017-08-01 DIAGNOSIS — M5126 Other intervertebral disc displacement, lumbar region: Secondary | ICD-10-CM | POA: Diagnosis not present

## 2017-08-03 DIAGNOSIS — M9903 Segmental and somatic dysfunction of lumbar region: Secondary | ICD-10-CM | POA: Diagnosis not present

## 2017-08-03 DIAGNOSIS — M9901 Segmental and somatic dysfunction of cervical region: Secondary | ICD-10-CM | POA: Diagnosis not present

## 2017-08-03 DIAGNOSIS — M5126 Other intervertebral disc displacement, lumbar region: Secondary | ICD-10-CM | POA: Diagnosis not present

## 2017-08-03 DIAGNOSIS — M50322 Other cervical disc degeneration at C5-C6 level: Secondary | ICD-10-CM | POA: Diagnosis not present

## 2017-08-08 DIAGNOSIS — M9903 Segmental and somatic dysfunction of lumbar region: Secondary | ICD-10-CM | POA: Diagnosis not present

## 2017-08-08 DIAGNOSIS — M50322 Other cervical disc degeneration at C5-C6 level: Secondary | ICD-10-CM | POA: Diagnosis not present

## 2017-08-08 DIAGNOSIS — M5126 Other intervertebral disc displacement, lumbar region: Secondary | ICD-10-CM | POA: Diagnosis not present

## 2017-08-08 DIAGNOSIS — M9901 Segmental and somatic dysfunction of cervical region: Secondary | ICD-10-CM | POA: Diagnosis not present

## 2017-08-10 DIAGNOSIS — M9903 Segmental and somatic dysfunction of lumbar region: Secondary | ICD-10-CM | POA: Diagnosis not present

## 2017-08-10 DIAGNOSIS — M9901 Segmental and somatic dysfunction of cervical region: Secondary | ICD-10-CM | POA: Diagnosis not present

## 2017-08-10 DIAGNOSIS — M5126 Other intervertebral disc displacement, lumbar region: Secondary | ICD-10-CM | POA: Diagnosis not present

## 2017-08-10 DIAGNOSIS — M50322 Other cervical disc degeneration at C5-C6 level: Secondary | ICD-10-CM | POA: Diagnosis not present

## 2017-08-14 DIAGNOSIS — M9903 Segmental and somatic dysfunction of lumbar region: Secondary | ICD-10-CM | POA: Diagnosis not present

## 2017-08-14 DIAGNOSIS — M9901 Segmental and somatic dysfunction of cervical region: Secondary | ICD-10-CM | POA: Diagnosis not present

## 2017-08-14 DIAGNOSIS — M50322 Other cervical disc degeneration at C5-C6 level: Secondary | ICD-10-CM | POA: Diagnosis not present

## 2017-08-14 DIAGNOSIS — M5126 Other intervertebral disc displacement, lumbar region: Secondary | ICD-10-CM | POA: Diagnosis not present

## 2017-08-21 DIAGNOSIS — M50322 Other cervical disc degeneration at C5-C6 level: Secondary | ICD-10-CM | POA: Diagnosis not present

## 2017-08-21 DIAGNOSIS — M9901 Segmental and somatic dysfunction of cervical region: Secondary | ICD-10-CM | POA: Diagnosis not present

## 2017-08-21 DIAGNOSIS — M5126 Other intervertebral disc displacement, lumbar region: Secondary | ICD-10-CM | POA: Diagnosis not present

## 2017-08-21 DIAGNOSIS — M9903 Segmental and somatic dysfunction of lumbar region: Secondary | ICD-10-CM | POA: Diagnosis not present

## 2017-08-28 DIAGNOSIS — M9903 Segmental and somatic dysfunction of lumbar region: Secondary | ICD-10-CM | POA: Diagnosis not present

## 2017-08-28 DIAGNOSIS — M9901 Segmental and somatic dysfunction of cervical region: Secondary | ICD-10-CM | POA: Diagnosis not present

## 2017-08-28 DIAGNOSIS — M50322 Other cervical disc degeneration at C5-C6 level: Secondary | ICD-10-CM | POA: Diagnosis not present

## 2017-08-28 DIAGNOSIS — R69 Illness, unspecified: Secondary | ICD-10-CM | POA: Diagnosis not present

## 2017-08-28 DIAGNOSIS — M5126 Other intervertebral disc displacement, lumbar region: Secondary | ICD-10-CM | POA: Diagnosis not present

## 2017-09-04 DIAGNOSIS — M50322 Other cervical disc degeneration at C5-C6 level: Secondary | ICD-10-CM | POA: Diagnosis not present

## 2017-09-04 DIAGNOSIS — M5126 Other intervertebral disc displacement, lumbar region: Secondary | ICD-10-CM | POA: Diagnosis not present

## 2017-09-04 DIAGNOSIS — M9901 Segmental and somatic dysfunction of cervical region: Secondary | ICD-10-CM | POA: Diagnosis not present

## 2017-09-04 DIAGNOSIS — M9903 Segmental and somatic dysfunction of lumbar region: Secondary | ICD-10-CM | POA: Diagnosis not present

## 2017-09-11 DIAGNOSIS — M9901 Segmental and somatic dysfunction of cervical region: Secondary | ICD-10-CM | POA: Diagnosis not present

## 2017-09-11 DIAGNOSIS — M50322 Other cervical disc degeneration at C5-C6 level: Secondary | ICD-10-CM | POA: Diagnosis not present

## 2017-09-11 DIAGNOSIS — M9903 Segmental and somatic dysfunction of lumbar region: Secondary | ICD-10-CM | POA: Diagnosis not present

## 2017-09-11 DIAGNOSIS — M5126 Other intervertebral disc displacement, lumbar region: Secondary | ICD-10-CM | POA: Diagnosis not present

## 2017-09-25 DIAGNOSIS — M50322 Other cervical disc degeneration at C5-C6 level: Secondary | ICD-10-CM | POA: Diagnosis not present

## 2017-09-25 DIAGNOSIS — M9901 Segmental and somatic dysfunction of cervical region: Secondary | ICD-10-CM | POA: Diagnosis not present

## 2017-09-25 DIAGNOSIS — M5126 Other intervertebral disc displacement, lumbar region: Secondary | ICD-10-CM | POA: Diagnosis not present

## 2017-09-25 DIAGNOSIS — M9903 Segmental and somatic dysfunction of lumbar region: Secondary | ICD-10-CM | POA: Diagnosis not present

## 2017-10-02 DIAGNOSIS — M9903 Segmental and somatic dysfunction of lumbar region: Secondary | ICD-10-CM | POA: Diagnosis not present

## 2017-10-02 DIAGNOSIS — M50322 Other cervical disc degeneration at C5-C6 level: Secondary | ICD-10-CM | POA: Diagnosis not present

## 2017-10-02 DIAGNOSIS — M9901 Segmental and somatic dysfunction of cervical region: Secondary | ICD-10-CM | POA: Diagnosis not present

## 2017-10-02 DIAGNOSIS — M5126 Other intervertebral disc displacement, lumbar region: Secondary | ICD-10-CM | POA: Diagnosis not present

## 2017-10-05 DIAGNOSIS — E039 Hypothyroidism, unspecified: Secondary | ICD-10-CM | POA: Diagnosis not present

## 2017-10-05 DIAGNOSIS — E559 Vitamin D deficiency, unspecified: Secondary | ICD-10-CM | POA: Diagnosis not present

## 2017-10-09 DIAGNOSIS — E039 Hypothyroidism, unspecified: Secondary | ICD-10-CM | POA: Diagnosis not present

## 2017-10-09 DIAGNOSIS — E559 Vitamin D deficiency, unspecified: Secondary | ICD-10-CM | POA: Diagnosis not present

## 2017-10-16 DIAGNOSIS — M50322 Other cervical disc degeneration at C5-C6 level: Secondary | ICD-10-CM | POA: Diagnosis not present

## 2017-10-16 DIAGNOSIS — M9903 Segmental and somatic dysfunction of lumbar region: Secondary | ICD-10-CM | POA: Diagnosis not present

## 2017-10-16 DIAGNOSIS — M5126 Other intervertebral disc displacement, lumbar region: Secondary | ICD-10-CM | POA: Diagnosis not present

## 2017-10-16 DIAGNOSIS — M9901 Segmental and somatic dysfunction of cervical region: Secondary | ICD-10-CM | POA: Diagnosis not present

## 2017-11-22 DIAGNOSIS — E559 Vitamin D deficiency, unspecified: Secondary | ICD-10-CM | POA: Diagnosis not present

## 2017-11-22 DIAGNOSIS — R5383 Other fatigue: Secondary | ICD-10-CM | POA: Diagnosis not present

## 2017-11-22 DIAGNOSIS — I10 Essential (primary) hypertension: Secondary | ICD-10-CM | POA: Diagnosis not present

## 2017-11-22 DIAGNOSIS — R209 Unspecified disturbances of skin sensation: Secondary | ICD-10-CM | POA: Diagnosis not present

## 2017-11-22 DIAGNOSIS — M542 Cervicalgia: Secondary | ICD-10-CM | POA: Diagnosis not present

## 2017-11-22 DIAGNOSIS — Z79899 Other long term (current) drug therapy: Secondary | ICD-10-CM | POA: Diagnosis not present

## 2017-11-22 DIAGNOSIS — D509 Iron deficiency anemia, unspecified: Secondary | ICD-10-CM | POA: Diagnosis not present

## 2017-11-22 DIAGNOSIS — E039 Hypothyroidism, unspecified: Secondary | ICD-10-CM | POA: Diagnosis not present

## 2017-11-24 DIAGNOSIS — E538 Deficiency of other specified B group vitamins: Secondary | ICD-10-CM | POA: Diagnosis not present

## 2017-12-25 DIAGNOSIS — E538 Deficiency of other specified B group vitamins: Secondary | ICD-10-CM | POA: Diagnosis not present

## 2018-01-24 DIAGNOSIS — E538 Deficiency of other specified B group vitamins: Secondary | ICD-10-CM | POA: Diagnosis not present

## 2018-01-24 DIAGNOSIS — E611 Iron deficiency: Secondary | ICD-10-CM | POA: Diagnosis not present

## 2018-01-26 DIAGNOSIS — Z79899 Other long term (current) drug therapy: Secondary | ICD-10-CM | POA: Diagnosis not present

## 2018-01-26 DIAGNOSIS — D72819 Decreased white blood cell count, unspecified: Secondary | ICD-10-CM | POA: Diagnosis not present

## 2018-01-26 DIAGNOSIS — E2839 Other primary ovarian failure: Secondary | ICD-10-CM | POA: Diagnosis not present

## 2018-01-26 DIAGNOSIS — I10 Essential (primary) hypertension: Secondary | ICD-10-CM | POA: Diagnosis not present

## 2018-01-26 DIAGNOSIS — E538 Deficiency of other specified B group vitamins: Secondary | ICD-10-CM | POA: Diagnosis not present

## 2018-01-26 DIAGNOSIS — Z136 Encounter for screening for cardiovascular disorders: Secondary | ICD-10-CM | POA: Diagnosis not present

## 2018-01-26 DIAGNOSIS — R7301 Impaired fasting glucose: Secondary | ICD-10-CM | POA: Diagnosis not present

## 2018-01-26 DIAGNOSIS — Z0001 Encounter for general adult medical examination with abnormal findings: Secondary | ICD-10-CM | POA: Diagnosis not present

## 2018-01-26 DIAGNOSIS — E039 Hypothyroidism, unspecified: Secondary | ICD-10-CM | POA: Diagnosis not present

## 2018-02-23 DIAGNOSIS — E538 Deficiency of other specified B group vitamins: Secondary | ICD-10-CM | POA: Diagnosis not present

## 2018-03-27 DIAGNOSIS — E538 Deficiency of other specified B group vitamins: Secondary | ICD-10-CM | POA: Diagnosis not present

## 2018-04-18 DIAGNOSIS — M8588 Other specified disorders of bone density and structure, other site: Secondary | ICD-10-CM | POA: Diagnosis not present

## 2018-04-18 DIAGNOSIS — E2839 Other primary ovarian failure: Secondary | ICD-10-CM | POA: Diagnosis not present

## 2018-05-24 DIAGNOSIS — H35033 Hypertensive retinopathy, bilateral: Secondary | ICD-10-CM | POA: Diagnosis not present

## 2018-05-24 DIAGNOSIS — H2511 Age-related nuclear cataract, right eye: Secondary | ICD-10-CM | POA: Diagnosis not present

## 2018-05-24 DIAGNOSIS — H2513 Age-related nuclear cataract, bilateral: Secondary | ICD-10-CM | POA: Diagnosis not present

## 2018-05-24 DIAGNOSIS — H25011 Cortical age-related cataract, right eye: Secondary | ICD-10-CM | POA: Diagnosis not present

## 2018-05-24 DIAGNOSIS — H25013 Cortical age-related cataract, bilateral: Secondary | ICD-10-CM | POA: Diagnosis not present

## 2018-05-24 DIAGNOSIS — H353132 Nonexudative age-related macular degeneration, bilateral, intermediate dry stage: Secondary | ICD-10-CM | POA: Diagnosis not present

## 2018-06-12 DIAGNOSIS — H2511 Age-related nuclear cataract, right eye: Secondary | ICD-10-CM | POA: Diagnosis not present

## 2018-06-12 DIAGNOSIS — H25811 Combined forms of age-related cataract, right eye: Secondary | ICD-10-CM | POA: Diagnosis not present

## 2018-08-06 DIAGNOSIS — D72819 Decreased white blood cell count, unspecified: Secondary | ICD-10-CM | POA: Diagnosis not present

## 2018-08-29 DIAGNOSIS — H3530 Unspecified macular degeneration: Secondary | ICD-10-CM | POA: Diagnosis not present

## 2018-08-29 DIAGNOSIS — Z961 Presence of intraocular lens: Secondary | ICD-10-CM | POA: Diagnosis not present

## 2018-08-29 DIAGNOSIS — H26491 Other secondary cataract, right eye: Secondary | ICD-10-CM | POA: Diagnosis not present

## 2018-08-29 DIAGNOSIS — Q120 Congenital cataract: Secondary | ICD-10-CM | POA: Diagnosis not present

## 2019-03-26 DIAGNOSIS — Z0001 Encounter for general adult medical examination with abnormal findings: Secondary | ICD-10-CM | POA: Diagnosis not present

## 2019-03-26 DIAGNOSIS — D72819 Decreased white blood cell count, unspecified: Secondary | ICD-10-CM | POA: Diagnosis not present

## 2019-03-26 DIAGNOSIS — I10 Essential (primary) hypertension: Secondary | ICD-10-CM | POA: Diagnosis not present

## 2019-03-26 DIAGNOSIS — E538 Deficiency of other specified B group vitamins: Secondary | ICD-10-CM | POA: Diagnosis not present

## 2019-03-26 DIAGNOSIS — E559 Vitamin D deficiency, unspecified: Secondary | ICD-10-CM | POA: Diagnosis not present

## 2019-03-26 DIAGNOSIS — R42 Dizziness and giddiness: Secondary | ICD-10-CM | POA: Diagnosis not present

## 2019-03-26 DIAGNOSIS — E039 Hypothyroidism, unspecified: Secondary | ICD-10-CM | POA: Diagnosis not present

## 2019-03-26 DIAGNOSIS — Z79899 Other long term (current) drug therapy: Secondary | ICD-10-CM | POA: Diagnosis not present

## 2019-03-28 DIAGNOSIS — H35033 Hypertensive retinopathy, bilateral: Secondary | ICD-10-CM | POA: Diagnosis not present

## 2019-03-28 DIAGNOSIS — H26491 Other secondary cataract, right eye: Secondary | ICD-10-CM | POA: Diagnosis not present

## 2019-03-28 DIAGNOSIS — H353132 Nonexudative age-related macular degeneration, bilateral, intermediate dry stage: Secondary | ICD-10-CM | POA: Diagnosis not present

## 2019-03-28 DIAGNOSIS — E119 Type 2 diabetes mellitus without complications: Secondary | ICD-10-CM | POA: Diagnosis not present

## 2019-04-08 DIAGNOSIS — R69 Illness, unspecified: Secondary | ICD-10-CM | POA: Diagnosis not present

## 2019-06-03 DIAGNOSIS — E7800 Pure hypercholesterolemia, unspecified: Secondary | ICD-10-CM | POA: Diagnosis not present

## 2019-07-26 DIAGNOSIS — L570 Actinic keratosis: Secondary | ICD-10-CM | POA: Diagnosis not present

## 2019-07-26 DIAGNOSIS — D225 Melanocytic nevi of trunk: Secondary | ICD-10-CM | POA: Diagnosis not present

## 2019-07-26 DIAGNOSIS — R208 Other disturbances of skin sensation: Secondary | ICD-10-CM | POA: Diagnosis not present

## 2019-07-26 DIAGNOSIS — L820 Inflamed seborrheic keratosis: Secondary | ICD-10-CM | POA: Diagnosis not present

## 2019-07-26 DIAGNOSIS — Z85828 Personal history of other malignant neoplasm of skin: Secondary | ICD-10-CM | POA: Diagnosis not present

## 2019-07-26 DIAGNOSIS — D1801 Hemangioma of skin and subcutaneous tissue: Secondary | ICD-10-CM | POA: Diagnosis not present

## 2019-07-26 DIAGNOSIS — D22111 Melanocytic nevi of right upper eyelid, including canthus: Secondary | ICD-10-CM | POA: Diagnosis not present

## 2019-07-26 DIAGNOSIS — L821 Other seborrheic keratosis: Secondary | ICD-10-CM | POA: Diagnosis not present

## 2019-07-31 ENCOUNTER — Encounter: Payer: Self-pay | Admitting: Podiatry

## 2019-07-31 ENCOUNTER — Ambulatory Visit: Payer: Medicare HMO | Admitting: Podiatry

## 2019-07-31 ENCOUNTER — Other Ambulatory Visit: Payer: Self-pay

## 2019-07-31 ENCOUNTER — Ambulatory Visit (INDEPENDENT_AMBULATORY_CARE_PROVIDER_SITE_OTHER): Payer: Medicare HMO

## 2019-07-31 DIAGNOSIS — B351 Tinea unguium: Secondary | ICD-10-CM | POA: Diagnosis not present

## 2019-07-31 DIAGNOSIS — M79674 Pain in right toe(s): Secondary | ICD-10-CM | POA: Diagnosis not present

## 2019-07-31 DIAGNOSIS — L600 Ingrowing nail: Secondary | ICD-10-CM | POA: Diagnosis not present

## 2019-07-31 DIAGNOSIS — M2021 Hallux rigidus, right foot: Secondary | ICD-10-CM

## 2019-07-31 DIAGNOSIS — M2022 Hallux rigidus, left foot: Secondary | ICD-10-CM

## 2019-07-31 MED ORDER — NEOMYCIN-POLYMYXIN-HC 3.5-10000-1 OT SOLN: 3.0000 [drp] | Freq: Four times a day (QID) | OTIC | 0 refills | Status: DC

## 2019-07-31 NOTE — Progress Notes (Signed)
Subjective:   Patient ID: Adriana Franklin, female   DOB: 79 y.o.   MRN: [id number]   HPI Patient presents stating she has had some history of bunion deformities which she wears wider shoes for and her right big toenail is getting increasingly thickened and impossible for her to cut and becomes painful.  Patient is concerned about ability to wear shoe gear and patient does not smoke likes to be active   Review of Systems  All other systems reviewed and are negative.       Objective:  Physical Exam Vitals and nursing note reviewed.  Constitutional:      Appearance: She is well-developed.  Pulmonary:     Effort: Pulmonary effort is normal.  Musculoskeletal:        General: Normal range of motion.  Skin:    General: Skin is warm.  Neurological:     Mental Status: She is alert.     Neurovascular status intact muscle strength found to be adequate range of motion was mildly reduced subtalar midtarsal joint.  Severely damaged right hallux nail that is dystrophic and dysfunctional and painful when pressed dorsally was noted and moderate bunion deformity bilateral     Assessment:  Damaged hallux nail right with thick dystrophic condition along with structural bunion deformity longstanding nature bilateral     Plan:  H&P reviewed condition and due to the longstanding chronic nature of nail I do think would be best removed and patient wanted this done and I infiltrated 60 mg like Marcaine mixture.  The she then decided she did not want it removed permanently and using sterile sharp instrumentation I debrided the nail down to reduce pressure and she can have that done periodically.  Reviewed x-rays and do not recommend current treatment of bunions unless they get worse  X-rays indicate structural bunion deformity with mild arthritis of the first MPJ bilateral

## 2019-08-07 DIAGNOSIS — E039 Hypothyroidism, unspecified: Secondary | ICD-10-CM | POA: Diagnosis not present

## 2019-08-07 DIAGNOSIS — E559 Vitamin D deficiency, unspecified: Secondary | ICD-10-CM | POA: Diagnosis not present

## 2019-08-12 DIAGNOSIS — E559 Vitamin D deficiency, unspecified: Secondary | ICD-10-CM | POA: Diagnosis not present

## 2019-08-12 DIAGNOSIS — E039 Hypothyroidism, unspecified: Secondary | ICD-10-CM | POA: Diagnosis not present

## 2019-09-25 DIAGNOSIS — H353132 Nonexudative age-related macular degeneration, bilateral, intermediate dry stage: Secondary | ICD-10-CM | POA: Diagnosis not present

## 2019-09-25 DIAGNOSIS — H35033 Hypertensive retinopathy, bilateral: Secondary | ICD-10-CM | POA: Diagnosis not present

## 2019-09-25 DIAGNOSIS — H2512 Age-related nuclear cataract, left eye: Secondary | ICD-10-CM | POA: Diagnosis not present

## 2019-09-25 DIAGNOSIS — E119 Type 2 diabetes mellitus without complications: Secondary | ICD-10-CM | POA: Diagnosis not present

## 2019-11-14 DIAGNOSIS — L239 Allergic contact dermatitis, unspecified cause: Secondary | ICD-10-CM | POA: Diagnosis not present

## 2020-02-13 DIAGNOSIS — E039 Hypothyroidism, unspecified: Secondary | ICD-10-CM | POA: Diagnosis not present

## 2020-02-13 DIAGNOSIS — E559 Vitamin D deficiency, unspecified: Secondary | ICD-10-CM | POA: Diagnosis not present

## 2020-02-17 DIAGNOSIS — E559 Vitamin D deficiency, unspecified: Secondary | ICD-10-CM | POA: Diagnosis not present

## 2020-02-17 DIAGNOSIS — Z79890 Hormone replacement therapy (postmenopausal): Secondary | ICD-10-CM | POA: Diagnosis not present

## 2020-02-17 DIAGNOSIS — E039 Hypothyroidism, unspecified: Secondary | ICD-10-CM | POA: Diagnosis not present

## 2020-02-18 DIAGNOSIS — L03115 Cellulitis of right lower limb: Secondary | ICD-10-CM | POA: Diagnosis not present

## 2020-03-03 DIAGNOSIS — Z961 Presence of intraocular lens: Secondary | ICD-10-CM | POA: Diagnosis not present

## 2020-03-03 DIAGNOSIS — H2512 Age-related nuclear cataract, left eye: Secondary | ICD-10-CM | POA: Diagnosis not present

## 2020-03-03 DIAGNOSIS — H35033 Hypertensive retinopathy, bilateral: Secondary | ICD-10-CM | POA: Diagnosis not present

## 2020-03-03 DIAGNOSIS — H353132 Nonexudative age-related macular degeneration, bilateral, intermediate dry stage: Secondary | ICD-10-CM | POA: Diagnosis not present

## 2020-03-26 DIAGNOSIS — M50322 Other cervical disc degeneration at C5-C6 level: Secondary | ICD-10-CM | POA: Diagnosis not present

## 2020-03-26 DIAGNOSIS — M25552 Pain in left hip: Secondary | ICD-10-CM | POA: Diagnosis not present

## 2020-03-26 DIAGNOSIS — M9905 Segmental and somatic dysfunction of pelvic region: Secondary | ICD-10-CM | POA: Diagnosis not present

## 2020-03-26 DIAGNOSIS — M9901 Segmental and somatic dysfunction of cervical region: Secondary | ICD-10-CM | POA: Diagnosis not present

## 2020-03-30 DIAGNOSIS — M9901 Segmental and somatic dysfunction of cervical region: Secondary | ICD-10-CM | POA: Diagnosis not present

## 2020-03-30 DIAGNOSIS — M50322 Other cervical disc degeneration at C5-C6 level: Secondary | ICD-10-CM | POA: Diagnosis not present

## 2020-03-30 DIAGNOSIS — M25552 Pain in left hip: Secondary | ICD-10-CM | POA: Diagnosis not present

## 2020-03-30 DIAGNOSIS — M9905 Segmental and somatic dysfunction of pelvic region: Secondary | ICD-10-CM | POA: Diagnosis not present

## 2020-04-06 DIAGNOSIS — M9905 Segmental and somatic dysfunction of pelvic region: Secondary | ICD-10-CM | POA: Diagnosis not present

## 2020-04-06 DIAGNOSIS — M25552 Pain in left hip: Secondary | ICD-10-CM | POA: Diagnosis not present

## 2020-04-06 DIAGNOSIS — M50322 Other cervical disc degeneration at C5-C6 level: Secondary | ICD-10-CM | POA: Diagnosis not present

## 2020-04-06 DIAGNOSIS — M9901 Segmental and somatic dysfunction of cervical region: Secondary | ICD-10-CM | POA: Diagnosis not present

## 2020-04-07 DIAGNOSIS — M5032 Other cervical disc degeneration, mid-cervical region, unspecified level: Secondary | ICD-10-CM | POA: Diagnosis not present

## 2020-04-07 DIAGNOSIS — M5136 Other intervertebral disc degeneration, lumbar region: Secondary | ICD-10-CM | POA: Diagnosis not present

## 2020-04-09 DIAGNOSIS — M50322 Other cervical disc degeneration at C5-C6 level: Secondary | ICD-10-CM | POA: Diagnosis not present

## 2020-04-09 DIAGNOSIS — M25552 Pain in left hip: Secondary | ICD-10-CM | POA: Diagnosis not present

## 2020-04-09 DIAGNOSIS — M9905 Segmental and somatic dysfunction of pelvic region: Secondary | ICD-10-CM | POA: Diagnosis not present

## 2020-04-09 DIAGNOSIS — M9901 Segmental and somatic dysfunction of cervical region: Secondary | ICD-10-CM | POA: Diagnosis not present

## 2020-04-13 DIAGNOSIS — M50322 Other cervical disc degeneration at C5-C6 level: Secondary | ICD-10-CM | POA: Diagnosis not present

## 2020-04-13 DIAGNOSIS — M9905 Segmental and somatic dysfunction of pelvic region: Secondary | ICD-10-CM | POA: Diagnosis not present

## 2020-04-13 DIAGNOSIS — M9901 Segmental and somatic dysfunction of cervical region: Secondary | ICD-10-CM | POA: Diagnosis not present

## 2020-04-13 DIAGNOSIS — M25552 Pain in left hip: Secondary | ICD-10-CM | POA: Diagnosis not present

## 2020-04-16 DIAGNOSIS — M50322 Other cervical disc degeneration at C5-C6 level: Secondary | ICD-10-CM | POA: Diagnosis not present

## 2020-04-16 DIAGNOSIS — M25552 Pain in left hip: Secondary | ICD-10-CM | POA: Diagnosis not present

## 2020-04-16 DIAGNOSIS — M9901 Segmental and somatic dysfunction of cervical region: Secondary | ICD-10-CM | POA: Diagnosis not present

## 2020-04-16 DIAGNOSIS — M9905 Segmental and somatic dysfunction of pelvic region: Secondary | ICD-10-CM | POA: Diagnosis not present

## 2020-04-20 DIAGNOSIS — M9901 Segmental and somatic dysfunction of cervical region: Secondary | ICD-10-CM | POA: Diagnosis not present

## 2020-04-20 DIAGNOSIS — M50322 Other cervical disc degeneration at C5-C6 level: Secondary | ICD-10-CM | POA: Diagnosis not present

## 2020-04-20 DIAGNOSIS — M9905 Segmental and somatic dysfunction of pelvic region: Secondary | ICD-10-CM | POA: Diagnosis not present

## 2020-04-20 DIAGNOSIS — M25552 Pain in left hip: Secondary | ICD-10-CM | POA: Diagnosis not present

## 2020-04-23 DIAGNOSIS — M25552 Pain in left hip: Secondary | ICD-10-CM | POA: Diagnosis not present

## 2020-04-23 DIAGNOSIS — M9901 Segmental and somatic dysfunction of cervical region: Secondary | ICD-10-CM | POA: Diagnosis not present

## 2020-04-23 DIAGNOSIS — M9905 Segmental and somatic dysfunction of pelvic region: Secondary | ICD-10-CM | POA: Diagnosis not present

## 2020-04-23 DIAGNOSIS — M50322 Other cervical disc degeneration at C5-C6 level: Secondary | ICD-10-CM | POA: Diagnosis not present

## 2020-04-27 DIAGNOSIS — M25552 Pain in left hip: Secondary | ICD-10-CM | POA: Diagnosis not present

## 2020-04-27 DIAGNOSIS — M50322 Other cervical disc degeneration at C5-C6 level: Secondary | ICD-10-CM | POA: Diagnosis not present

## 2020-04-27 DIAGNOSIS — M9901 Segmental and somatic dysfunction of cervical region: Secondary | ICD-10-CM | POA: Diagnosis not present

## 2020-04-27 DIAGNOSIS — M9905 Segmental and somatic dysfunction of pelvic region: Secondary | ICD-10-CM | POA: Diagnosis not present

## 2020-04-30 DIAGNOSIS — M25552 Pain in left hip: Secondary | ICD-10-CM | POA: Diagnosis not present

## 2020-04-30 DIAGNOSIS — M9901 Segmental and somatic dysfunction of cervical region: Secondary | ICD-10-CM | POA: Diagnosis not present

## 2020-04-30 DIAGNOSIS — M9905 Segmental and somatic dysfunction of pelvic region: Secondary | ICD-10-CM | POA: Diagnosis not present

## 2020-04-30 DIAGNOSIS — M50322 Other cervical disc degeneration at C5-C6 level: Secondary | ICD-10-CM | POA: Diagnosis not present

## 2020-05-04 DIAGNOSIS — M9905 Segmental and somatic dysfunction of pelvic region: Secondary | ICD-10-CM | POA: Diagnosis not present

## 2020-05-04 DIAGNOSIS — M9901 Segmental and somatic dysfunction of cervical region: Secondary | ICD-10-CM | POA: Diagnosis not present

## 2020-05-04 DIAGNOSIS — M25552 Pain in left hip: Secondary | ICD-10-CM | POA: Diagnosis not present

## 2020-05-04 DIAGNOSIS — M50322 Other cervical disc degeneration at C5-C6 level: Secondary | ICD-10-CM | POA: Diagnosis not present

## 2020-05-12 DIAGNOSIS — M50322 Other cervical disc degeneration at C5-C6 level: Secondary | ICD-10-CM | POA: Diagnosis not present

## 2020-05-12 DIAGNOSIS — M25552 Pain in left hip: Secondary | ICD-10-CM | POA: Diagnosis not present

## 2020-05-12 DIAGNOSIS — M9901 Segmental and somatic dysfunction of cervical region: Secondary | ICD-10-CM | POA: Diagnosis not present

## 2020-05-12 DIAGNOSIS — M9905 Segmental and somatic dysfunction of pelvic region: Secondary | ICD-10-CM | POA: Diagnosis not present

## 2020-05-20 DIAGNOSIS — Z0001 Encounter for general adult medical examination with abnormal findings: Secondary | ICD-10-CM | POA: Diagnosis not present

## 2020-05-20 DIAGNOSIS — D333 Benign neoplasm of cranial nerves: Secondary | ICD-10-CM | POA: Diagnosis not present

## 2020-05-20 DIAGNOSIS — R7309 Other abnormal glucose: Secondary | ICD-10-CM | POA: Diagnosis not present

## 2020-05-20 DIAGNOSIS — K644 Residual hemorrhoidal skin tags: Secondary | ICD-10-CM | POA: Diagnosis not present

## 2020-05-20 DIAGNOSIS — E7800 Pure hypercholesterolemia, unspecified: Secondary | ICD-10-CM | POA: Diagnosis not present

## 2020-05-20 DIAGNOSIS — E538 Deficiency of other specified B group vitamins: Secondary | ICD-10-CM | POA: Diagnosis not present

## 2020-05-20 DIAGNOSIS — Z79899 Other long term (current) drug therapy: Secondary | ICD-10-CM | POA: Diagnosis not present

## 2020-05-20 DIAGNOSIS — I10 Essential (primary) hypertension: Secondary | ICD-10-CM | POA: Diagnosis not present

## 2020-06-18 DIAGNOSIS — B029 Zoster without complications: Secondary | ICD-10-CM | POA: Diagnosis not present

## 2020-06-23 DIAGNOSIS — D518 Other vitamin B12 deficiency anemias: Secondary | ICD-10-CM | POA: Diagnosis not present

## 2020-07-28 DIAGNOSIS — D1801 Hemangioma of skin and subcutaneous tissue: Secondary | ICD-10-CM | POA: Diagnosis not present

## 2020-07-28 DIAGNOSIS — D225 Melanocytic nevi of trunk: Secondary | ICD-10-CM | POA: Diagnosis not present

## 2020-07-28 DIAGNOSIS — D2272 Melanocytic nevi of left lower limb, including hip: Secondary | ICD-10-CM | POA: Diagnosis not present

## 2020-07-28 DIAGNOSIS — B029 Zoster without complications: Secondary | ICD-10-CM | POA: Diagnosis not present

## 2020-07-28 DIAGNOSIS — Z85828 Personal history of other malignant neoplasm of skin: Secondary | ICD-10-CM | POA: Diagnosis not present

## 2020-07-28 DIAGNOSIS — D2261 Melanocytic nevi of right upper limb, including shoulder: Secondary | ICD-10-CM | POA: Diagnosis not present

## 2020-07-28 DIAGNOSIS — L720 Epidermal cyst: Secondary | ICD-10-CM | POA: Diagnosis not present

## 2020-07-28 DIAGNOSIS — L821 Other seborrheic keratosis: Secondary | ICD-10-CM | POA: Diagnosis not present

## 2020-08-11 DIAGNOSIS — E538 Deficiency of other specified B group vitamins: Secondary | ICD-10-CM | POA: Diagnosis not present

## 2020-08-11 DIAGNOSIS — B0223 Postherpetic polyneuropathy: Secondary | ICD-10-CM | POA: Diagnosis not present

## 2020-08-11 DIAGNOSIS — I10 Essential (primary) hypertension: Secondary | ICD-10-CM | POA: Diagnosis not present

## 2020-08-11 DIAGNOSIS — H9192 Unspecified hearing loss, left ear: Secondary | ICD-10-CM | POA: Diagnosis not present

## 2020-08-17 DIAGNOSIS — E559 Vitamin D deficiency, unspecified: Secondary | ICD-10-CM | POA: Diagnosis not present

## 2020-08-17 DIAGNOSIS — E039 Hypothyroidism, unspecified: Secondary | ICD-10-CM | POA: Diagnosis not present

## 2020-08-19 DIAGNOSIS — Z289 Immunization not carried out for unspecified reason: Secondary | ICD-10-CM | POA: Diagnosis not present

## 2020-08-19 DIAGNOSIS — E559 Vitamin D deficiency, unspecified: Secondary | ICD-10-CM | POA: Diagnosis not present

## 2020-08-19 DIAGNOSIS — E039 Hypothyroidism, unspecified: Secondary | ICD-10-CM | POA: Diagnosis not present

## 2020-09-28 DIAGNOSIS — D518 Other vitamin B12 deficiency anemias: Secondary | ICD-10-CM | POA: Diagnosis not present

## 2020-10-26 DIAGNOSIS — D518 Other vitamin B12 deficiency anemias: Secondary | ICD-10-CM | POA: Diagnosis not present

## 2020-12-02 DIAGNOSIS — D518 Other vitamin B12 deficiency anemias: Secondary | ICD-10-CM | POA: Diagnosis not present

## 2021-03-01 DIAGNOSIS — E039 Hypothyroidism, unspecified: Secondary | ICD-10-CM | POA: Diagnosis not present

## 2021-03-01 DIAGNOSIS — E559 Vitamin D deficiency, unspecified: Secondary | ICD-10-CM | POA: Diagnosis not present

## 2021-03-05 DIAGNOSIS — E039 Hypothyroidism, unspecified: Secondary | ICD-10-CM | POA: Diagnosis not present

## 2021-03-05 DIAGNOSIS — E559 Vitamin D deficiency, unspecified: Secondary | ICD-10-CM | POA: Diagnosis not present

## 2021-03-10 DIAGNOSIS — L304 Erythema intertrigo: Secondary | ICD-10-CM | POA: Diagnosis not present

## 2021-03-10 DIAGNOSIS — L820 Inflamed seborrheic keratosis: Secondary | ICD-10-CM | POA: Diagnosis not present

## 2021-03-10 DIAGNOSIS — D225 Melanocytic nevi of trunk: Secondary | ICD-10-CM | POA: Diagnosis not present

## 2021-03-10 DIAGNOSIS — L821 Other seborrheic keratosis: Secondary | ICD-10-CM | POA: Diagnosis not present

## 2021-03-10 DIAGNOSIS — L814 Other melanin hyperpigmentation: Secondary | ICD-10-CM | POA: Diagnosis not present

## 2021-03-10 DIAGNOSIS — B351 Tinea unguium: Secondary | ICD-10-CM | POA: Diagnosis not present

## 2021-03-10 DIAGNOSIS — B0229 Other postherpetic nervous system involvement: Secondary | ICD-10-CM | POA: Diagnosis not present

## 2021-04-19 DIAGNOSIS — E039 Hypothyroidism, unspecified: Secondary | ICD-10-CM | POA: Diagnosis not present

## 2021-05-04 ENCOUNTER — Emergency Department (HOSPITAL_BASED_OUTPATIENT_CLINIC_OR_DEPARTMENT_OTHER)
Admission: EM | Admit: 2021-05-04 | Discharge: 2021-05-04 | Disposition: A | Discharge status: Home / Self Care | Payer: Medicare HMO | Attending: Emergency Medicine | Admitting: Emergency Medicine

## 2021-05-04 ENCOUNTER — Emergency Department (HOSPITAL_BASED_OUTPATIENT_CLINIC_OR_DEPARTMENT_OTHER): Payer: Medicare HMO

## 2021-05-04 ENCOUNTER — Other Ambulatory Visit: Payer: Self-pay

## 2021-05-04 ENCOUNTER — Encounter (HOSPITAL_BASED_OUTPATIENT_CLINIC_OR_DEPARTMENT_OTHER): Payer: Self-pay | Admitting: *Deleted

## 2021-05-04 DIAGNOSIS — R200 Anesthesia of skin: Secondary | ICD-10-CM | POA: Diagnosis not present

## 2021-05-04 DIAGNOSIS — R0602 Shortness of breath: Secondary | ICD-10-CM | POA: Diagnosis not present

## 2021-05-04 DIAGNOSIS — Z87891 Personal history of nicotine dependence: Secondary | ICD-10-CM | POA: Diagnosis not present

## 2021-05-04 DIAGNOSIS — R519 Headache, unspecified: Secondary | ICD-10-CM | POA: Insufficient documentation

## 2021-05-04 DIAGNOSIS — R42 Dizziness and giddiness: Secondary | ICD-10-CM | POA: Insufficient documentation

## 2021-05-04 DIAGNOSIS — Z7982 Long term (current) use of aspirin: Secondary | ICD-10-CM | POA: Insufficient documentation

## 2021-05-04 DIAGNOSIS — R202 Paresthesia of skin: Secondary | ICD-10-CM | POA: Diagnosis not present

## 2021-05-04 HISTORY — DX: Zoster without complications: B02.9

## 2021-05-04 LAB — CBC WITH DIFFERENTIAL/PLATELET
Abs Immature Granulocytes: 0.01 10*3/uL (ref 0.00–0.07)
Basophils Absolute: 0 10*3/uL (ref 0.0–0.1)
Basophils Relative: 1 %
Eosinophils Absolute: 0.1 10*3/uL (ref 0.0–0.5)
Eosinophils Relative: 2 %
HCT: 40.6 % (ref 36.0–46.0)
Hemoglobin: 13.7 g/dL (ref 12.0–15.0)
Immature Granulocytes: 0 %
Lymphocytes Relative: 41 %
Lymphs Abs: 1.8 10*3/uL (ref 0.7–4.0)
MCH: 27.3 pg (ref 26.0–34.0)
MCHC: 33.7 g/dL (ref 30.0–36.0)
MCV: 80.9 fL (ref 80.0–100.0)
Monocytes Absolute: 0.4 10*3/uL (ref 0.1–1.0)
Monocytes Relative: 10 %
Neutro Abs: 2 10*3/uL (ref 1.7–7.7)
Neutrophils Relative %: 46 %
Platelets: 203 10*3/uL (ref 150–400)
RBC: 5.02 MIL/uL (ref 3.87–5.11)
RDW: 13.6 % (ref 11.5–15.5)
WBC: 4.4 10*3/uL (ref 4.0–10.5)
nRBC: 0 % (ref 0.0–0.2)

## 2021-05-04 LAB — COMPREHENSIVE METABOLIC PANEL
ALT: 21 U/L (ref 0–44)
AST: 24 U/L (ref 15–41)
Albumin: 4.3 g/dL (ref 3.5–5.0)
Alkaline Phosphatase: 67 U/L (ref 38–126)
Anion gap: 9 (ref 5–15)
BUN: 11 mg/dL (ref 8–23)
CO2: 27 mmol/L (ref 22–32)
Calcium: 9.4 mg/dL (ref 8.9–10.3)
Chloride: 103 mmol/L (ref 98–111)
Creatinine, Ser: 0.69 mg/dL (ref 0.44–1.00)
GFR, Estimated: >60 mL/min (ref >60)
Glucose, Bld: 93 mg/dL (ref 70–99)
Potassium: 3.8 mmol/L (ref 3.5–5.1)
Sodium: 139 mmol/L (ref 135–145)
Total Bilirubin: 1.2 mg/dL (ref 0.3–1.2)
Total Protein: 7.1 g/dL (ref 6.5–8.1)

## 2021-05-04 NOTE — ED Provider Notes (Signed)
[facility] Provider Note   CSN: [id number] Arrival date & time: 05/04/21  1440     History Chief Complaint  Patient presents with   Numbness    Adriana Franklin is a 80 y.o. female.  HPI     Late November of last year, after having shingles of left side  Numbness of the left side, left leg, left arm, left face, comes and goes Face is the mildest, becomes more mild from legs up to face Is there, varying degrees of severity. Unclear if it is there constantly and she is distracted at times or if it is coming and going, now is developing symptoms in left leg but not arm and face at this moment.  Happens every day, but not sure how many times a day, lasts a while.  Denies numbness, weakness, difficulty talking or walking, visual changes or facial droop. Dull headache, attribute to sinuses, takes tylenol and it improves. No chest pain. Shortness of breath mild chronic not changed.  Episodes of vertigo, has hearing loss, not changed.   Hx of spinal TB as a child, had surgeries  Was looking online and saw numbness associated with stroke, wrote family physician a note, triage nurse called this AM,PCP recommended coming here  B12 deficiency, not taking it now  Past Medical History:  Diagnosis Date   Allergy    itching   Anemia    Arthralgia    Arthritis    Depression    Fatigue    Hashimoto's disease    Hearing loss    Hernia    Hyperlipidemia    Muscle cramps    Muscle pain    Obesity    Raynaud's syndrome    Shingles    Shortness of breath    Sleep apnea    Vertigo     Patient Active Problem List   Diagnosis Date Noted   Anxiety state, unspecified 04/10/2014   Cataract 04/10/2014   Paronychia 02/19/2014   Onychia and paronychia of toe 02/19/2014   Cellulitis of toe, right 02/12/2014   Onychomycosis of right great toe 02/12/2014   Anemia, iron deficiency 06/25/2013   GERD (gastroesophageal reflux disease) 11/29/2012   Neck pain  11/29/2012   Cerumen impaction 11/05/2012   Hyperlipidemia 11/01/2012   Hashimoto's disease 11/01/2012   Morbid obesity ([place]) 11/01/2012   Allergy    Fatigue     Past Surgical History:  Procedure Laterality Date   APPENDECTOMY     KNEE ARTHROSCOPY Left    [place]   for sleep apnea treatment   TONSILLECTOMY       OB History   No obstetric history on file.     Family History  Problem Relation Age of Onset   Hypertension Mother    Heart disease Father    Learning disabilities Sister     Social History   Tobacco Use   Smoking status: Former    Packs/day: 2.00    Years: 10.00    Pack years: 20.00    Types: Cigarettes    Quit date: 07/28/1984    Years since quitting: 36.7   Smokeless tobacco: Never  Vaping Use   Vaping Use: Never used  Substance Use Topics   Alcohol use: No   Drug use: No    Home Medications Prior to Admission medications   Medication Sig Start Date End Date Taking? Authorizing Provider  albuterol (PROVENTIL HFA;VENTOLIN HFA) 108 (90 Base)  MCG/ACT inhaler Inhale 2 puffs into the lungs every 4 (four) hours as needed for wheezing or shortness of breath. 07/07/16   Janne Napoleon, NP  AMBULATORY NON FORMULARY MEDICATION Medication Name: Ultimate H.A supplement: 2 by mouth daily    [provider]  AMBULATORY NON FORMULARY MEDICATION Medication Name: AstaFX- 2 by mouth daily    [provider]  aspirin 81 MG tablet Take 81 mg by mouth daily.    [provider]  Cholecalciferol (VITAMIN D3) 5000 UNITS CAPS Take by mouth daily.    [provider]  Ferrous Gluconate (IRON) 240 (27 FE) MG TABS Take by mouth daily.    [provider]  hydrochlorothiazide (HYDRODIURIL) 25 MG tablet Take 1 tablet (25 mg total) by mouth as needed (For Swelling). 01/28/15   Gildardo Cranker, DO  levothyroxine (SYNTHROID, LEVOTHROID) 125 MCG tablet Take 125 mcg by mouth daily before breakfast.      [provider]  omega-3 acid ethyl esters (LOVAZA) 1 G capsule Take 1 g by mouth daily.    [provider]  predniSONE (DELTASONE) 20 MG tablet Take 2 tablets daily X 6 days. Take with food. 07/07/16   Janne Napoleon, NP    Allergies    Onion  Review of Systems   Review of Systems  Constitutional:  Negative for fever.  Eyes:  Negative for visual disturbance.  Respiratory:  Negative for cough and shortness of breath.   Cardiovascular:  Negative for chest pain.  Gastrointestinal:  Negative for abdominal pain, nausea and vomiting.  Genitourinary:  Negative for difficulty urinating.  Musculoskeletal:  Negative for back pain and neck pain.  Skin:  Negative for rash.  Neurological:  Positive for numbness. Negative for syncope, facial asymmetry, speech difficulty, weakness and headaches.   Physical Exam Updated Vital Signs BP (!) 162/90 (BP Location: Right Arm)   Pulse 83   Temp 98 F (36.7 C)   Resp 15   Ht 5\' 1"  (1.549 m)   Wt 87.1 kg   SpO2 98%   BMI 36.28 kg/m   Physical Exam Vitals and nursing note reviewed.  Constitutional:      General: She is not in acute distress.    Appearance: Normal appearance. She is well-developed. She is not ill-appearing or diaphoretic.  HENT:     Head: Normocephalic and atraumatic.  Eyes:     General: No visual field deficit.    Extraocular Movements: Extraocular movements intact.     Conjunctiva/sclera: Conjunctivae normal.     Pupils: Pupils are equal, round, and reactive to light.  Cardiovascular:     Rate and Rhythm: Normal rate and regular rhythm.     Pulses: Normal pulses.     Heart sounds: Normal heart sounds. No murmur heard.   No friction rub. No gallop.  Pulmonary:     Effort: Pulmonary effort is normal. No respiratory distress.     Breath sounds: Normal breath sounds. No wheezing or rales.  Abdominal:     General: There is no distension.     Palpations: Abdomen is soft.     Tenderness: There is no abdominal  tenderness. There is no guarding.  Musculoskeletal:        General: No swelling or tenderness.     Cervical back: Normal range of motion.  Skin:    General: Skin is warm and dry.     Findings: No erythema or rash.  Neurological:     General: No focal deficit present.  Mental Status: She is alert and oriented to person, place, and time.     GCS: GCS eye subscore is 4. GCS verbal subscore is 5. GCS motor subscore is 6.     Cranial Nerves: No cranial nerve deficit, dysarthria or facial asymmetry.     Sensory: No sensory deficit.     Motor: No weakness or tremor.     Coordination: Coordination normal. Finger-Nose-Finger Test normal.     Gait: Gait normal.     Comments: Numbness left medial foot    ED Results / Procedures / Treatments   Labs (all labs ordered are listed, but only abnormal results are displayed) Labs Reviewed  CBC WITH DIFFERENTIAL/PLATELET  COMPREHENSIVE METABOLIC PANEL    EKG EKG Interpretation  Date/Time:  Tuesday May 04 2021 17:45:51 EDT Ventricular Rate:  81 PR Interval:  176 QRS Duration: 100 QT Interval:  383 QTC Calculation: 445 R Axis:   16 Text Interpretation: Sinus rhythm Abnormal R-wave progression, early transition No significant change since last tracing Confirmed by Gareth Morgan [phone]) on 05/04/2021 5:56:48 PM  Radiology CT Head Wo Contrast  Result Date: 05/04/2021 CLINICAL DATA:  Left side numbness tingling this afternoon No history TIA htnNumbness or tingling, paresthesia (Ped 0-18y) EXAM: CT HEAD WITHOUT CONTRAST TECHNIQUE: Contiguous axial images were obtained from the base of the skull through the vertex without intravenous contrast. COMPARISON:  None. FINDINGS: Brain: No acute intracranial hemorrhage. No focal mass lesion. No CT evidence of acute infarction. No midline shift or mass effect. No hydrocephalus. Basilar cisterns are patent. Minimal periventricular and subcortical white matter hypodensities. Minimal generalized  cortical atrophy. Vascular: No hyperdense vessel or unexpected calcification. Skull: Normal. Negative for fracture or focal lesion. Sinuses/Orbits: Paranasal sinuses and mastoid air cells are clear. Orbits are clear. Other: None. IMPRESSION: No acute intracranial findings. Electronically Signed   By: Suzy Bouchard M.D.   On: 05/04/2021 17:57    Procedures Procedures   Medications Ordered in ED Medications - No data to display  ED Course  I have reviewed the triage vital signs and the nursing notes.  Pertinent labs & imaging results that were available during my care of the patient were reviewed by me and considered in my medical decision making (see chart for details).    MDM Rules/Calculators/A&P                           80yo female with history of hyperlipidemia, hashimoto's, presents with concern for left sided numbness per recommendation of her PCP.  Has had symptoms waxing and waning over the last year. Given concern for symptoms, numbness on exam, screening head CT complted to evaluate for large mass/signs of older infarct or ICH shows no acute abnormalities. No significant electrolyte abnormalities to account for symptoms. Discussed recommendation for outpatient PCP and neurology follow up for symptoms.     Final Clinical Impression(s) / ED Diagnoses Final diagnoses:  Numbness    Rx / DC Orders ED Discharge Orders     None        Gareth Morgan, MD 05/05/21 1053

## 2021-05-04 NOTE — ED Triage Notes (Addendum)
Pt states she has been having numbness at intervals since Nov. States she started having numbness in her left leg going up into her arm today, called PCP who told her to come here. Pat ambulatory with steady gait. Pt goes on to endorse that she has been having these symptoms since she had shingles last year.

## 2021-05-04 NOTE — ED Notes (Signed)
Pt left without discharge instructions.

## 2021-06-16 DIAGNOSIS — E7800 Pure hypercholesterolemia, unspecified: Secondary | ICD-10-CM | POA: Diagnosis not present

## 2021-06-16 DIAGNOSIS — E559 Vitamin D deficiency, unspecified: Secondary | ICD-10-CM | POA: Diagnosis not present

## 2021-06-16 DIAGNOSIS — R7309 Other abnormal glucose: Secondary | ICD-10-CM | POA: Diagnosis not present

## 2021-06-16 DIAGNOSIS — E039 Hypothyroidism, unspecified: Secondary | ICD-10-CM | POA: Diagnosis not present

## 2021-06-16 DIAGNOSIS — E2839 Other primary ovarian failure: Secondary | ICD-10-CM | POA: Diagnosis not present

## 2021-06-16 DIAGNOSIS — Z23 Encounter for immunization: Secondary | ICD-10-CM | POA: Diagnosis not present

## 2021-06-16 DIAGNOSIS — Z0001 Encounter for general adult medical examination with abnormal findings: Secondary | ICD-10-CM | POA: Diagnosis not present

## 2021-06-16 DIAGNOSIS — E538 Deficiency of other specified B group vitamins: Secondary | ICD-10-CM | POA: Diagnosis not present

## 2021-06-16 DIAGNOSIS — I10 Essential (primary) hypertension: Secondary | ICD-10-CM | POA: Diagnosis not present

## 2021-06-16 DIAGNOSIS — Z79899 Other long term (current) drug therapy: Secondary | ICD-10-CM | POA: Diagnosis not present

## 2021-06-30 DIAGNOSIS — E039 Hypothyroidism, unspecified: Secondary | ICD-10-CM | POA: Diagnosis not present

## 2021-07-02 DIAGNOSIS — E559 Vitamin D deficiency, unspecified: Secondary | ICD-10-CM | POA: Diagnosis not present

## 2021-07-02 DIAGNOSIS — E039 Hypothyroidism, unspecified: Secondary | ICD-10-CM | POA: Diagnosis not present

## 2021-07-02 DIAGNOSIS — E049 Nontoxic goiter, unspecified: Secondary | ICD-10-CM | POA: Diagnosis not present

## 2021-07-02 DIAGNOSIS — Z79899 Other long term (current) drug therapy: Secondary | ICD-10-CM | POA: Diagnosis not present

## 2021-07-02 DIAGNOSIS — R29898 Other symptoms and signs involving the musculoskeletal system: Secondary | ICD-10-CM | POA: Diagnosis not present

## 2021-07-23 DIAGNOSIS — H2512 Age-related nuclear cataract, left eye: Secondary | ICD-10-CM | POA: Diagnosis not present

## 2021-07-23 DIAGNOSIS — H353132 Nonexudative age-related macular degeneration, bilateral, intermediate dry stage: Secondary | ICD-10-CM | POA: Diagnosis not present

## 2021-07-23 DIAGNOSIS — E119 Type 2 diabetes mellitus without complications: Secondary | ICD-10-CM | POA: Diagnosis not present

## 2021-07-23 DIAGNOSIS — H35033 Hypertensive retinopathy, bilateral: Secondary | ICD-10-CM | POA: Diagnosis not present

## 2021-07-30 DIAGNOSIS — E039 Hypothyroidism, unspecified: Secondary | ICD-10-CM | POA: Diagnosis not present

## 2021-09-10 DIAGNOSIS — D225 Melanocytic nevi of trunk: Secondary | ICD-10-CM | POA: Diagnosis not present

## 2021-09-10 DIAGNOSIS — L728 Other follicular cysts of the skin and subcutaneous tissue: Secondary | ICD-10-CM | POA: Diagnosis not present

## 2021-09-10 DIAGNOSIS — L538 Other specified erythematous conditions: Secondary | ICD-10-CM | POA: Diagnosis not present

## 2021-09-10 DIAGNOSIS — R202 Paresthesia of skin: Secondary | ICD-10-CM | POA: Diagnosis not present

## 2021-09-10 DIAGNOSIS — L821 Other seborrheic keratosis: Secondary | ICD-10-CM | POA: Diagnosis not present

## 2021-09-10 DIAGNOSIS — L814 Other melanin hyperpigmentation: Secondary | ICD-10-CM | POA: Diagnosis not present

## 2021-09-10 DIAGNOSIS — L298 Other pruritus: Secondary | ICD-10-CM | POA: Diagnosis not present

## 2021-09-10 DIAGNOSIS — L820 Inflamed seborrheic keratosis: Secondary | ICD-10-CM | POA: Diagnosis not present

## 2021-10-20 DIAGNOSIS — I10 Essential (primary) hypertension: Secondary | ICD-10-CM | POA: Diagnosis not present

## 2021-10-20 DIAGNOSIS — E559 Vitamin D deficiency, unspecified: Secondary | ICD-10-CM | POA: Diagnosis not present

## 2021-10-20 DIAGNOSIS — E039 Hypothyroidism, unspecified: Secondary | ICD-10-CM | POA: Diagnosis not present

## 2021-10-20 DIAGNOSIS — E7800 Pure hypercholesterolemia, unspecified: Secondary | ICD-10-CM | POA: Diagnosis not present

## 2021-10-20 DIAGNOSIS — M6208 Separation of muscle (nontraumatic), other site: Secondary | ICD-10-CM | POA: Diagnosis not present

## 2021-10-20 DIAGNOSIS — Z79899 Other long term (current) drug therapy: Secondary | ICD-10-CM | POA: Diagnosis not present

## 2021-11-17 DIAGNOSIS — E039 Hypothyroidism, unspecified: Secondary | ICD-10-CM | POA: Diagnosis not present

## 2021-11-17 DIAGNOSIS — E559 Vitamin D deficiency, unspecified: Secondary | ICD-10-CM | POA: Diagnosis not present

## 2021-11-17 DIAGNOSIS — E785 Hyperlipidemia, unspecified: Secondary | ICD-10-CM | POA: Diagnosis not present

## 2021-11-19 DIAGNOSIS — Z79890 Hormone replacement therapy (postmenopausal): Secondary | ICD-10-CM | POA: Diagnosis not present

## 2021-11-19 DIAGNOSIS — E785 Hyperlipidemia, unspecified: Secondary | ICD-10-CM | POA: Diagnosis not present

## 2021-11-19 DIAGNOSIS — Z6837 Body mass index (BMI) 37.0-37.9, adult: Secondary | ICD-10-CM | POA: Diagnosis not present

## 2021-11-19 DIAGNOSIS — E039 Hypothyroidism, unspecified: Secondary | ICD-10-CM | POA: Diagnosis not present

## 2021-11-19 DIAGNOSIS — E559 Vitamin D deficiency, unspecified: Secondary | ICD-10-CM | POA: Diagnosis not present

## 2021-11-19 DIAGNOSIS — E049 Nontoxic goiter, unspecified: Secondary | ICD-10-CM | POA: Diagnosis not present

## 2022-03-30 DIAGNOSIS — E559 Vitamin D deficiency, unspecified: Secondary | ICD-10-CM | POA: Diagnosis not present

## 2022-03-30 DIAGNOSIS — E039 Hypothyroidism, unspecified: Secondary | ICD-10-CM | POA: Diagnosis not present

## 2022-03-30 DIAGNOSIS — E785 Hyperlipidemia, unspecified: Secondary | ICD-10-CM | POA: Diagnosis not present

## 2022-03-30 DIAGNOSIS — Z6837 Body mass index (BMI) 37.0-37.9, adult: Secondary | ICD-10-CM | POA: Diagnosis not present

## 2022-04-01 DIAGNOSIS — Z6837 Body mass index (BMI) 37.0-37.9, adult: Secondary | ICD-10-CM | POA: Diagnosis not present

## 2022-04-01 DIAGNOSIS — E039 Hypothyroidism, unspecified: Secondary | ICD-10-CM | POA: Diagnosis not present

## 2022-04-01 DIAGNOSIS — E785 Hyperlipidemia, unspecified: Secondary | ICD-10-CM | POA: Diagnosis not present

## 2022-04-01 DIAGNOSIS — R7303 Prediabetes: Secondary | ICD-10-CM | POA: Diagnosis not present

## 2022-04-01 DIAGNOSIS — Z79890 Hormone replacement therapy (postmenopausal): Secondary | ICD-10-CM | POA: Diagnosis not present

## 2022-04-01 DIAGNOSIS — E559 Vitamin D deficiency, unspecified: Secondary | ICD-10-CM | POA: Diagnosis not present

## 2022-04-01 DIAGNOSIS — E538 Deficiency of other specified B group vitamins: Secondary | ICD-10-CM | POA: Diagnosis not present

## 2022-04-28 IMAGING — CT CT HEAD W/O CM
4 series · 16 of 47 positions shown, 18 images · non-contrast
Comparison: None.

CLINICAL DATA: Left side numbness tingling this afternoon No
history TIA htnNumbness or tingling, paresthesia (Ped 0-18y)

EXAM:
CT HEAD WITHOUT CONTRAST
TECHNIQUE: Contiguous axial images were obtained from the base of the skull
through the vertex without intravenous contrast.

[Series 2: head wo · axial · 0.43mm/px · z∈[+709,+829]mm · 7 of 34 slices shown, 9 images]
[im 5/34  brain]
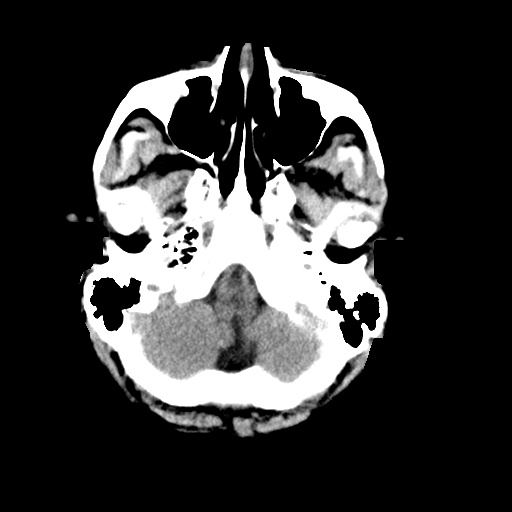
[im 5/34  bone]
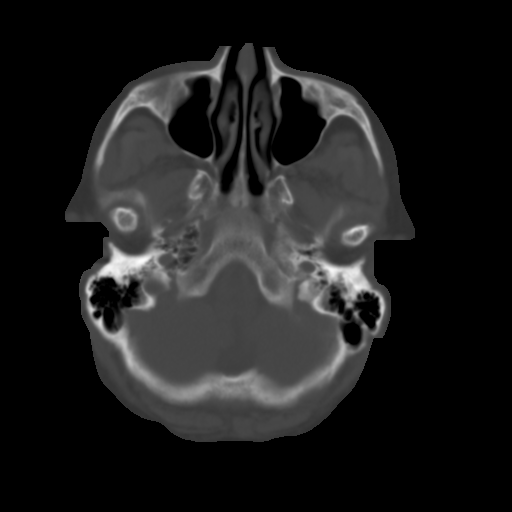
[im 9/34  brain]
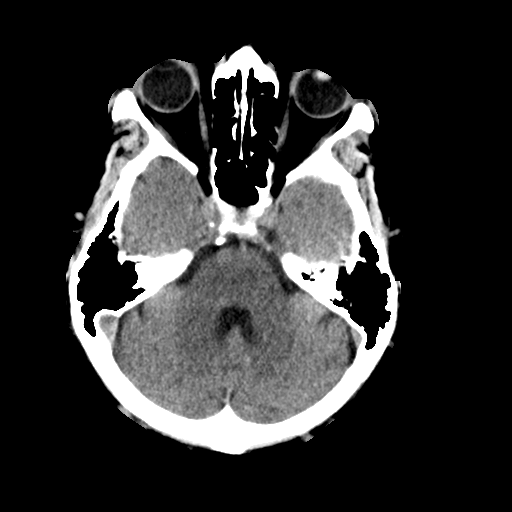
[im 13/34  brain]
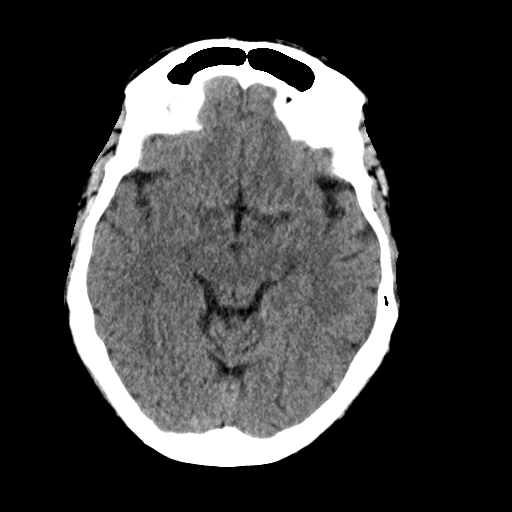
[im 17/34  brain]
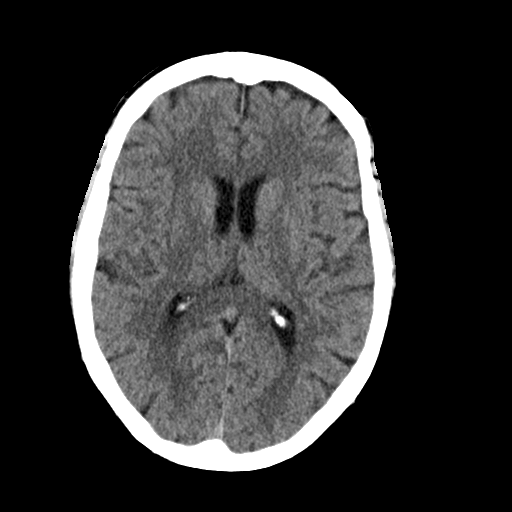
[im 21/34  brain]
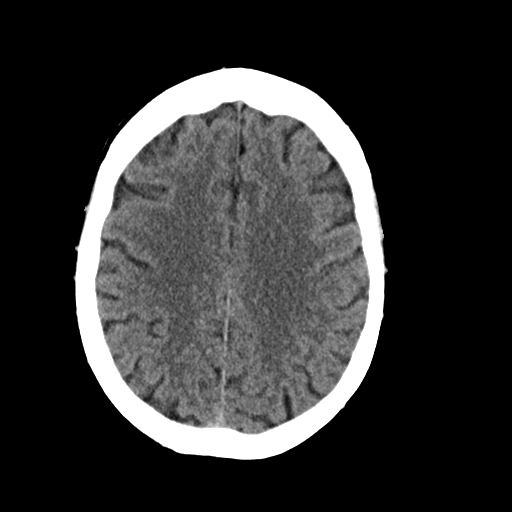
[im 21/34  bone]
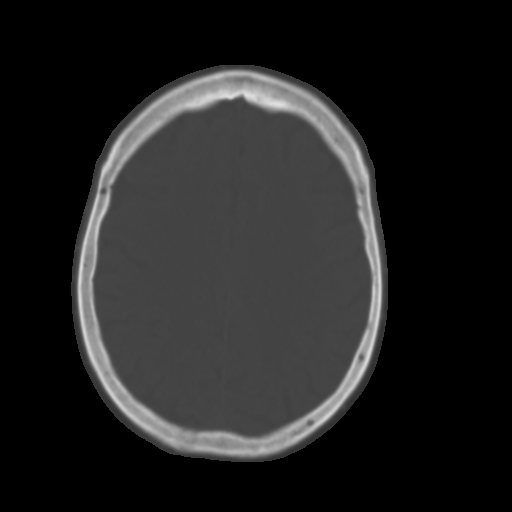
[im 25/34  brain]
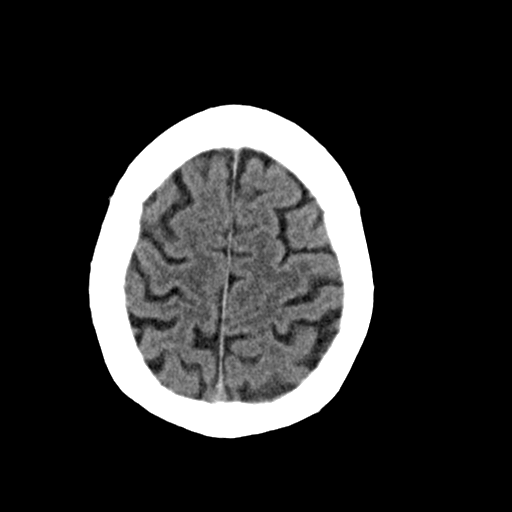
[im 29/34  brain]
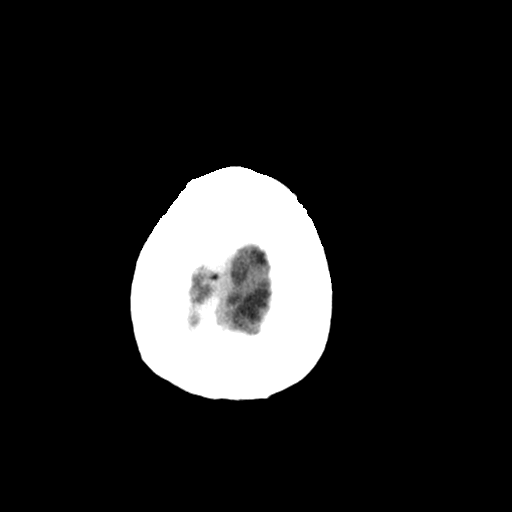

[Series 3: head bone · axial · 0.43mm/px · z∈[+705,+739]mm · 3 of 85 slices shown]
[im 9/85  bone]
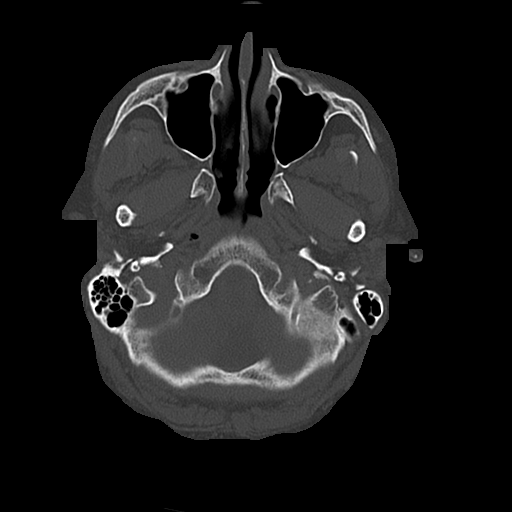
[im 17/85  bone]
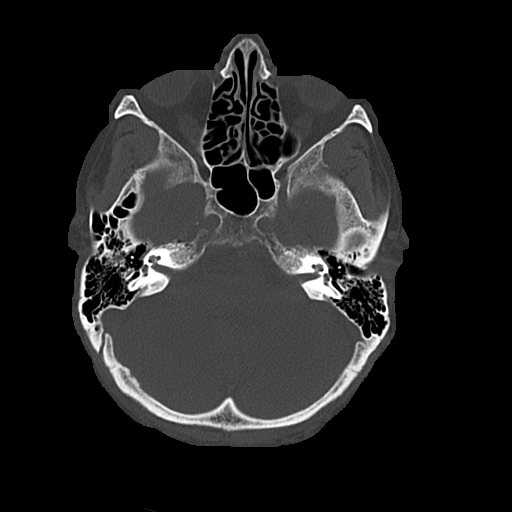
[im 26/85  bone]
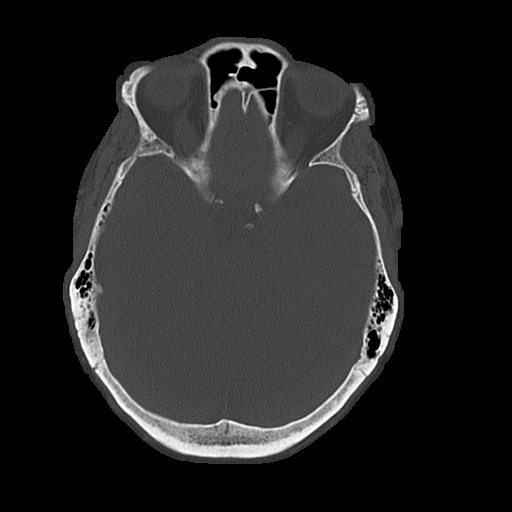

[Series 4: coronal soft · coronal · 0.32mm/px · 3 of 68 slices shown]
[im 23/68  brain]
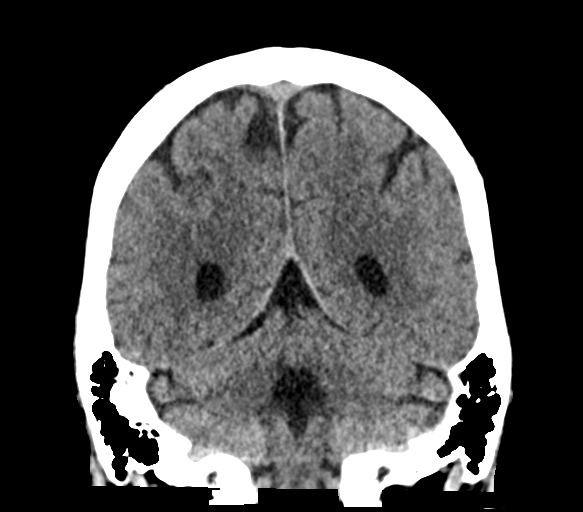
[im 30/68  brain]
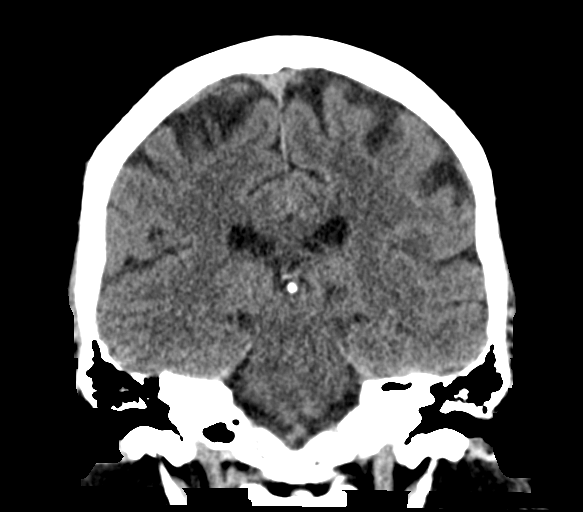
[im 38/68  brain]
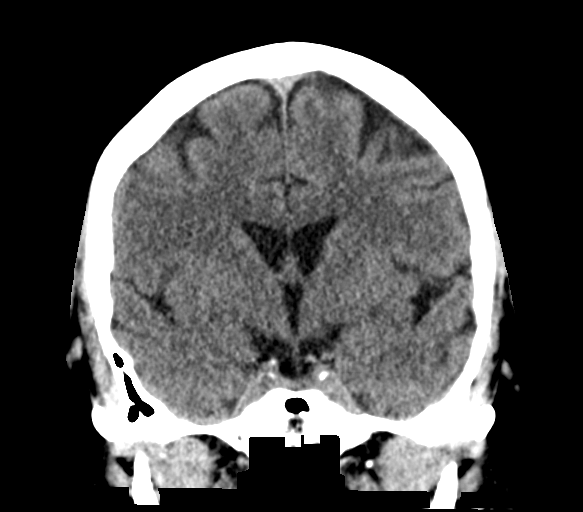

[Series 5: sagittal soft · sagittal · 0.31mm/px · 3 of 55 slices shown]
[im 19/55  brain]
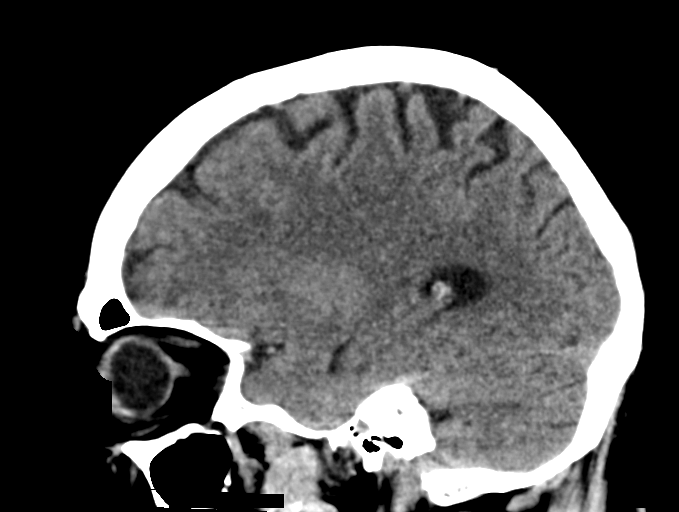
[im 28/55  brain]
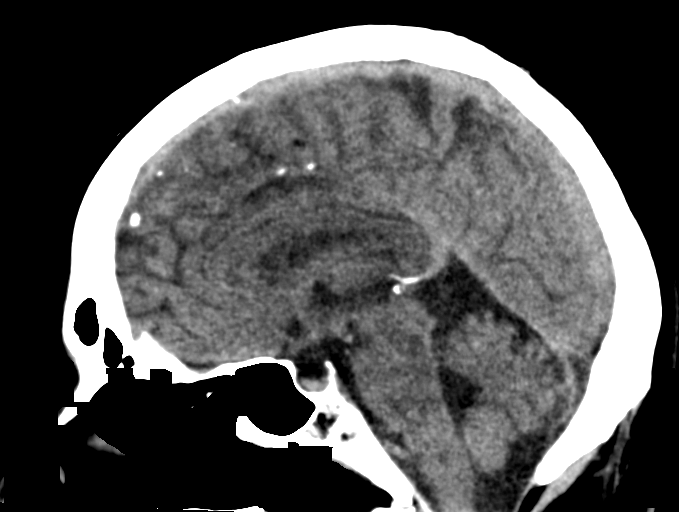
[im 37/55  brain]
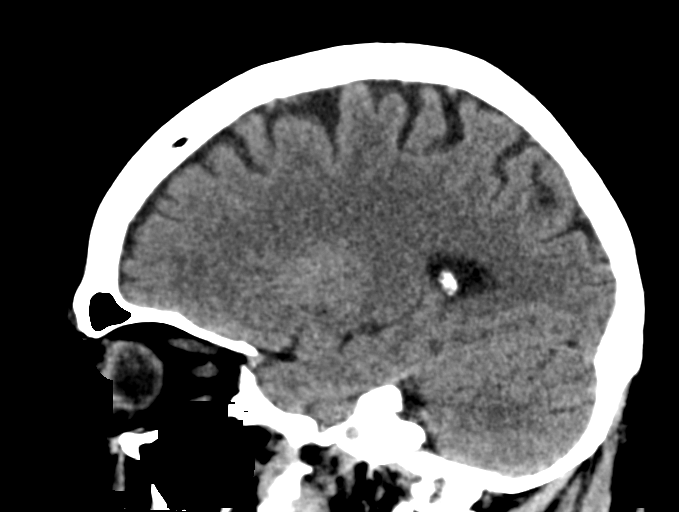

[16 of 47 positions shown; findings below may reference images not displayed]

FINDINGS: Brain: No acute intracranial hemorrhage. No focal mass lesion. No CT
evidence of acute infarction. No midline shift or mass effect. No
hydrocephalus. Basilar cisterns are patent.

Minimal periventricular and subcortical white matter hypodensities.
Minimal generalized cortical atrophy.

Vascular: No hyperdense vessel or unexpected calcification.

Skull: Normal. Negative for fracture or focal lesion.

Sinuses/Orbits: Paranasal sinuses and mastoid air cells are clear.
Orbits are clear.

Other: None.
IMPRESSION: No acute intracranial findings.

## 2022-05-04 DIAGNOSIS — E860 Dehydration: Secondary | ICD-10-CM | POA: Diagnosis not present

## 2022-06-20 DIAGNOSIS — R7301 Impaired fasting glucose: Secondary | ICD-10-CM | POA: Diagnosis not present

## 2022-06-20 DIAGNOSIS — E7800 Pure hypercholesterolemia, unspecified: Secondary | ICD-10-CM | POA: Diagnosis not present

## 2022-06-20 DIAGNOSIS — E039 Hypothyroidism, unspecified: Secondary | ICD-10-CM | POA: Diagnosis not present

## 2022-06-20 DIAGNOSIS — H9191 Unspecified hearing loss, right ear: Secondary | ICD-10-CM | POA: Diagnosis not present

## 2022-06-20 DIAGNOSIS — D333 Benign neoplasm of cranial nerves: Secondary | ICD-10-CM | POA: Diagnosis not present

## 2022-06-20 DIAGNOSIS — Z0001 Encounter for general adult medical examination with abnormal findings: Secondary | ICD-10-CM | POA: Diagnosis not present

## 2022-06-20 DIAGNOSIS — E538 Deficiency of other specified B group vitamins: Secondary | ICD-10-CM | POA: Diagnosis not present

## 2022-06-20 DIAGNOSIS — Z79899 Other long term (current) drug therapy: Secondary | ICD-10-CM | POA: Diagnosis not present

## 2022-07-27 DIAGNOSIS — E538 Deficiency of other specified B group vitamins: Secondary | ICD-10-CM | POA: Diagnosis not present

## 2022-07-27 DIAGNOSIS — D72819 Decreased white blood cell count, unspecified: Secondary | ICD-10-CM | POA: Diagnosis not present

## 2022-08-03 DIAGNOSIS — H02831 Dermatochalasis of right upper eyelid: Secondary | ICD-10-CM | POA: Diagnosis not present

## 2022-09-13 DIAGNOSIS — L821 Other seborrheic keratosis: Secondary | ICD-10-CM | POA: Diagnosis not present

## 2022-09-13 DIAGNOSIS — L814 Other melanin hyperpigmentation: Secondary | ICD-10-CM | POA: Diagnosis not present

## 2022-09-13 DIAGNOSIS — Z7189 Other specified counseling: Secondary | ICD-10-CM | POA: Diagnosis not present

## 2022-09-13 DIAGNOSIS — D225 Melanocytic nevi of trunk: Secondary | ICD-10-CM | POA: Diagnosis not present

## 2022-09-28 DIAGNOSIS — E785 Hyperlipidemia, unspecified: Secondary | ICD-10-CM | POA: Diagnosis not present

## 2022-09-28 DIAGNOSIS — R7303 Prediabetes: Secondary | ICD-10-CM | POA: Diagnosis not present

## 2022-09-28 DIAGNOSIS — E039 Hypothyroidism, unspecified: Secondary | ICD-10-CM | POA: Diagnosis not present

## 2022-09-28 DIAGNOSIS — E538 Deficiency of other specified B group vitamins: Secondary | ICD-10-CM | POA: Diagnosis not present

## 2022-10-21 DIAGNOSIS — E782 Mixed hyperlipidemia: Secondary | ICD-10-CM | POA: Diagnosis not present

## 2022-10-21 DIAGNOSIS — E039 Hypothyroidism, unspecified: Secondary | ICD-10-CM | POA: Diagnosis not present

## 2022-10-21 DIAGNOSIS — E785 Hyperlipidemia, unspecified: Secondary | ICD-10-CM | POA: Diagnosis not present

## 2022-10-21 DIAGNOSIS — Z6836 Body mass index (BMI) 36.0-36.9, adult: Secondary | ICD-10-CM | POA: Diagnosis not present

## 2022-10-21 DIAGNOSIS — R7303 Prediabetes: Secondary | ICD-10-CM | POA: Diagnosis not present

## 2022-10-21 DIAGNOSIS — E559 Vitamin D deficiency, unspecified: Secondary | ICD-10-CM | POA: Diagnosis not present

## 2022-12-20 DIAGNOSIS — R2689 Other abnormalities of gait and mobility: Secondary | ICD-10-CM | POA: Diagnosis not present

## 2022-12-20 DIAGNOSIS — E538 Deficiency of other specified B group vitamins: Secondary | ICD-10-CM | POA: Diagnosis not present

## 2022-12-20 DIAGNOSIS — R7301 Impaired fasting glucose: Secondary | ICD-10-CM | POA: Diagnosis not present

## 2022-12-20 DIAGNOSIS — H9191 Unspecified hearing loss, right ear: Secondary | ICD-10-CM | POA: Diagnosis not present

## 2022-12-20 DIAGNOSIS — D333 Benign neoplasm of cranial nerves: Secondary | ICD-10-CM | POA: Diagnosis not present

## 2022-12-20 DIAGNOSIS — E7800 Pure hypercholesterolemia, unspecified: Secondary | ICD-10-CM | POA: Diagnosis not present

## 2022-12-20 DIAGNOSIS — E039 Hypothyroidism, unspecified: Secondary | ICD-10-CM | POA: Diagnosis not present

## 2022-12-28 DIAGNOSIS — M25512 Pain in left shoulder: Secondary | ICD-10-CM | POA: Diagnosis not present

## 2022-12-28 DIAGNOSIS — M6281 Muscle weakness (generalized): Secondary | ICD-10-CM | POA: Diagnosis not present

## 2022-12-28 DIAGNOSIS — R262 Difficulty in walking, not elsewhere classified: Secondary | ICD-10-CM | POA: Diagnosis not present

## 2023-01-03 DIAGNOSIS — M6281 Muscle weakness (generalized): Secondary | ICD-10-CM | POA: Diagnosis not present

## 2023-01-03 DIAGNOSIS — R262 Difficulty in walking, not elsewhere classified: Secondary | ICD-10-CM | POA: Diagnosis not present

## 2023-01-03 DIAGNOSIS — M25512 Pain in left shoulder: Secondary | ICD-10-CM | POA: Diagnosis not present

## 2023-01-05 DIAGNOSIS — R262 Difficulty in walking, not elsewhere classified: Secondary | ICD-10-CM | POA: Diagnosis not present

## 2023-01-05 DIAGNOSIS — M6281 Muscle weakness (generalized): Secondary | ICD-10-CM | POA: Diagnosis not present

## 2023-01-05 DIAGNOSIS — M25512 Pain in left shoulder: Secondary | ICD-10-CM | POA: Diagnosis not present

## 2023-01-10 DIAGNOSIS — R262 Difficulty in walking, not elsewhere classified: Secondary | ICD-10-CM | POA: Diagnosis not present

## 2023-01-10 DIAGNOSIS — M6281 Muscle weakness (generalized): Secondary | ICD-10-CM | POA: Diagnosis not present

## 2023-01-10 DIAGNOSIS — M25512 Pain in left shoulder: Secondary | ICD-10-CM | POA: Diagnosis not present

## 2023-01-10 DIAGNOSIS — H25812 Combined forms of age-related cataract, left eye: Secondary | ICD-10-CM | POA: Diagnosis not present

## 2023-01-12 DIAGNOSIS — R262 Difficulty in walking, not elsewhere classified: Secondary | ICD-10-CM | POA: Diagnosis not present

## 2023-01-12 DIAGNOSIS — M25512 Pain in left shoulder: Secondary | ICD-10-CM | POA: Diagnosis not present

## 2023-01-12 DIAGNOSIS — M6281 Muscle weakness (generalized): Secondary | ICD-10-CM | POA: Diagnosis not present

## 2023-01-17 DIAGNOSIS — M6281 Muscle weakness (generalized): Secondary | ICD-10-CM | POA: Diagnosis not present

## 2023-01-17 DIAGNOSIS — R262 Difficulty in walking, not elsewhere classified: Secondary | ICD-10-CM | POA: Diagnosis not present

## 2023-01-17 DIAGNOSIS — M25512 Pain in left shoulder: Secondary | ICD-10-CM | POA: Diagnosis not present

## 2023-01-20 DIAGNOSIS — M6281 Muscle weakness (generalized): Secondary | ICD-10-CM | POA: Diagnosis not present

## 2023-01-20 DIAGNOSIS — M25512 Pain in left shoulder: Secondary | ICD-10-CM | POA: Diagnosis not present

## 2023-01-20 DIAGNOSIS — R262 Difficulty in walking, not elsewhere classified: Secondary | ICD-10-CM | POA: Diagnosis not present

## 2023-01-24 DIAGNOSIS — R262 Difficulty in walking, not elsewhere classified: Secondary | ICD-10-CM | POA: Diagnosis not present

## 2023-01-24 DIAGNOSIS — M6281 Muscle weakness (generalized): Secondary | ICD-10-CM | POA: Diagnosis not present

## 2023-01-24 DIAGNOSIS — M25512 Pain in left shoulder: Secondary | ICD-10-CM | POA: Diagnosis not present

## 2023-01-26 DIAGNOSIS — R262 Difficulty in walking, not elsewhere classified: Secondary | ICD-10-CM | POA: Diagnosis not present

## 2023-01-26 DIAGNOSIS — M6281 Muscle weakness (generalized): Secondary | ICD-10-CM | POA: Diagnosis not present

## 2023-01-26 DIAGNOSIS — M25512 Pain in left shoulder: Secondary | ICD-10-CM | POA: Diagnosis not present

## 2023-01-31 DIAGNOSIS — H268 Other specified cataract: Secondary | ICD-10-CM | POA: Diagnosis not present

## 2023-01-31 DIAGNOSIS — H25812 Combined forms of age-related cataract, left eye: Secondary | ICD-10-CM | POA: Diagnosis not present

## 2023-08-16 DIAGNOSIS — Z0100 Encounter for examination of eyes and vision without abnormal findings: Secondary | ICD-10-CM | POA: Diagnosis not present

## 2023-09-13 DIAGNOSIS — L821 Other seborrheic keratosis: Secondary | ICD-10-CM | POA: Diagnosis not present

## 2023-09-13 DIAGNOSIS — L814 Other melanin hyperpigmentation: Secondary | ICD-10-CM | POA: Diagnosis not present

## 2023-09-13 DIAGNOSIS — Z7189 Other specified counseling: Secondary | ICD-10-CM | POA: Diagnosis not present

## 2023-09-13 DIAGNOSIS — L720 Epidermal cyst: Secondary | ICD-10-CM | POA: Diagnosis not present

## 2023-09-13 DIAGNOSIS — D225 Melanocytic nevi of trunk: Secondary | ICD-10-CM | POA: Diagnosis not present

## 2023-09-13 DIAGNOSIS — L304 Erythema intertrigo: Secondary | ICD-10-CM | POA: Diagnosis not present

## 2023-10-05 DIAGNOSIS — E039 Hypothyroidism, unspecified: Secondary | ICD-10-CM | POA: Diagnosis not present

## 2023-10-06 ENCOUNTER — Other Ambulatory Visit (HOSPITAL_COMMUNITY): Payer: Self-pay | Admitting: Family Medicine

## 2023-10-06 DIAGNOSIS — R1310 Dysphagia, unspecified: Secondary | ICD-10-CM

## 2023-10-17 DIAGNOSIS — E039 Hypothyroidism, unspecified: Secondary | ICD-10-CM | POA: Diagnosis not present

## 2023-10-23 ENCOUNTER — Ambulatory Visit (HOSPITAL_COMMUNITY)
Admission: RE | Admit: 2023-10-23 | Discharge: 2023-10-23 | Disposition: A | Discharge status: Home / Self Care | Source: Ambulatory Visit | Attending: Family Medicine | Admitting: Family Medicine

## 2023-10-23 DIAGNOSIS — R1310 Dysphagia, unspecified: Secondary | ICD-10-CM | POA: Insufficient documentation

## 2023-10-23 DIAGNOSIS — K224 Dyskinesia of esophagus: Secondary | ICD-10-CM | POA: Diagnosis not present

## 2023-10-23 DIAGNOSIS — K449 Diaphragmatic hernia without obstruction or gangrene: Secondary | ICD-10-CM | POA: Diagnosis not present

## 2023-11-08 ENCOUNTER — Ambulatory Visit: Attending: Family Medicine

## 2023-11-08 DIAGNOSIS — R1310 Dysphagia, unspecified: Secondary | ICD-10-CM | POA: Insufficient documentation

## 2023-11-08 NOTE — Patient Instructions (Signed)
?

## 2023-11-08 NOTE — Therapy (Signed)
 OUTPATIENT SPEECH LANGUAGE PATHOLOGY SWALLOW EVALUATION   Patient Name: Adriana Franklin MRN: [id number] DOB:[birth year], 83 y.o., female Today's Date: 11/08/2023  PCP: Lanae Pinal, MD REFERRING PROVIDER: Lanae Pinal, MD  END OF SESSION:  End of Session - 11/08/23 1348     Visit Number 1    Number of Visits 9    Date for SLP Re-Evaluation 01/03/24    Authorization Type [employer]    SLP Start Time 0930    SLP Stop Time  1015    SLP Time Calculation (min) 45 min    Activity Tolerance Patient tolerated treatment well             Past Medical History:  Diagnosis Date   Allergy    itching   Anemia    Arthralgia    Arthritis    Depression    Fatigue    Hashimoto's disease    Hearing loss    Hernia    Hyperlipidemia    Muscle cramps    Muscle pain    Obesity    Raynaud's syndrome    Shingles    Shortness of breath    Sleep apnea    Vertigo    Past Surgical History:  Procedure Laterality Date   APPENDECTOMY     KNEE ARTHROSCOPY Left    SPINE SURGERY  1946   THROAT SURGERY  1992   for sleep apnea treatment   TONSILLECTOMY     Patient Active Problem List   Diagnosis Date Noted   Anxiety state 04/10/2014   Cataract 04/10/2014   Paronychia 02/19/2014   Onychia and paronychia of toe 02/19/2014   Cellulitis of toe, right 02/12/2014   Onychomycosis of right great toe 02/12/2014   Anemia, iron deficiency 06/25/2013   GERD (gastroesophageal reflux disease) 11/29/2012   Neck pain 11/29/2012   Cerumen impaction 11/05/2012   Hyperlipidemia 11/01/2012   Hashimoto's disease 11/01/2012   Morbid obesity (HCC) 11/01/2012   Allergy    Fatigue     ONSET DATE: 10/30/2023 (referral date)   REFERRING DIAG: R13.10 (ICD-10-CM) - Dysphagia, unspecified  THERAPY DIAG: Dysphagia, unspecified type  Rationale for Evaluation and Treatment: Rehabilitation  SUBJECTIVE:   SUBJECTIVE STATEMENT: "I don't know why I'm here" Pt accompanied by: self  PERTINENT  HISTORY: Hypothyroidism, acoustic neuroma, moderate-sized diverticulum in mid/upper thoracic esophagus, moderate esophageal dysmotility, hiatal hernia  PAIN: Are you having pain? No  FALLS: Has patient fallen in last 6 months?  No  LIVING ENVIRONMENT: Lives with: lives with their spouse Lives in: House/apartment  PLOF:  Level of assistance: Independent with ADLs, Independent with IADLs Employment: Retired (previously in [employer] pilot)  PATIENT GOALS: improve swallowing  OBJECTIVE:  Note: Objective measures were completed at Evaluation unless otherwise noted. OBJECTIVE:   INSTRUMENTAL SWALLOW STUDY FINDINGS (MBSS) Recommended today to objectively assess swallow function   COGNITION: Overall cognitive status: Within functional limits for tasks assessed  SUBJECTIVE DYSPHAGIA REPORTS:  Date of onset: "It's been going on for many years" Reported symptoms: Pill dysphagia - reported stasis w/ select uncoated medication. Occasional coughing with particular items (ex: cilantro, spicy/salty foods)  Current diet: regular and thin liquids  Co-morbid voice changes: No  FACTORS WHICH MAY INCREASE RISK OF ADVERSE EVENT IN PRESENCE OF ASPIRATION:  General health: well appearing  Risk factors: potential GERD?    ORAL MOTOR EXAMINATION: Overall status: WFL  CLINICAL SWALLOW ASSESSMENT:   Vocal quality at baseline: normal Patient directly observed with POs: No Feeding: able to feed self Comments:  did not assess d/t need for extensive education  PATIENT REPORTED OUTCOME MEASURES (PROM): EAT-10: 4  PILL-5: 4 RSI=10 (>13 strong indication of LPR)  Question Patient's Response  My swallowing problem has caused me to lose weight 0  2.  My swallowing problem interferes with my ability to go out to meals 0  3.  Swallowing liquids takes extra effort 0  4.  Swallowing solids takes extra effort 0  5.  Swallowing pills takes extra effort 3  6.  Swallowing is painful 0  7.  The pleasure of eating is  affected by my swallowing 0  8.  When I swallow food sticks in my throat 1  9.  I cough when I eat 0  10.  Swallowing is stressful  0  0= No problem 4= Severe problem                                                                                                                            TREATMENT DATE:  11/08/23: ST evaluation and POC complete. Provided extensive education re: rationale for ST evaluation, potential areas of deficit, and recommendation for MBSS to evaluate pharyngeal function and aspiration risk based on patient reports. Utilized visual aids to increase pt understanding of swallowing mechanisms. Pt verbalized understanding and expressed appreciation for additional education.   PATIENT EDUCATION: Education details: see above Person educated: Patient Education method: Explanation Education comprehension: verbalized understanding and needs further education   ASSESSMENT:  CLINICAL IMPRESSION: Patient is a 83 y.o. F who was seen today for dysphagia. Esophagram revealed moderate diverticulum and dysmotility. Pt unable to swallow pill during exam prompting recommendation for MBSS. Today, pt reports some select pill dysphagia with particular uncoated medication x1. Initially denied difficulty swallowing solids and liquids, but endorsed occasional uncontrollably coughing "when it hits a certain spot" with particular foods (salty or spicy foods). SLP questions GERD based on patient report. Given some inconsistent/intermittent report of dysphagia, recommended MBSS to further evaluate pharyngeal function and aspiration risk. Pt verbalized understanding and agreement, despite some hesitancy.   OBJECTIVE IMPAIRMENTS: include dysphagia. Factors affecting potential to achieve goals and functional outcome are cooperation/participation level. Patient will benefit from skilled SLP services to address above impairments and improve overall function.  REHAB POTENTIAL: Good  GOALS: Goals  reviewed with patient? Yes  SHORT TERM GOALS: Target date: 12/06/2023  Pt will complete objective swallow study (MBSS) to evaluate pharyngeal function (add goals as needed) Baseline: Goal status: INITIAL  2.  Pt will carryover swallow precautions given rare min A Baseline:  Goal status: INITIAL  3.  Pt will complete swallow exercises in therapy given rare min A (if warranted) Baseline:  Goal status: INITIAL   LONG TERM GOALS: Target date: 01/03/2024  Pt will compete HEP with mod I  Baseline:  Goal status: INITIAL  2.  Pt will carryover SLP recommendations with mod I  Baseline:  Goal status: INITIAL  3.  Pt will report improved swallow  function via PROM by 2 points by LTG  Baseline: EAT-10: 4 PILL-5: 4 Goal status: INITIAL  PLAN:  SLP FREQUENCY: 1x/week  SLP DURATION: 8 weeks  PLANNED INTERVENTIONS: Aspiration precaution training, Pharyngeal strengthening exercises, Diet toleration management , Environmental controls, Cueing hierachy, Internal/external aids, Functional tasks, SLP instruction and feedback, Compensatory strategies, Patient/family education, and [id number] Treatment of swallowing function    Tamar Fairly, CCC-SLP 11/08/2023, 2:35 PM

## 2024-01-17 DIAGNOSIS — R202 Paresthesia of skin: Secondary | ICD-10-CM | POA: Diagnosis not present

## 2024-01-23 DIAGNOSIS — E039 Hypothyroidism, unspecified: Secondary | ICD-10-CM | POA: Diagnosis not present

## 2024-03-21 DIAGNOSIS — H02834 Dermatochalasis of left upper eyelid: Secondary | ICD-10-CM | POA: Diagnosis not present

## 2024-03-21 DIAGNOSIS — H353132 Nonexudative age-related macular degeneration, bilateral, intermediate dry stage: Secondary | ICD-10-CM | POA: Diagnosis not present

## 2024-03-21 DIAGNOSIS — Z961 Presence of intraocular lens: Secondary | ICD-10-CM | POA: Diagnosis not present

## 2024-07-15 DIAGNOSIS — M25562 Pain in left knee: Secondary | ICD-10-CM | POA: Diagnosis not present

## 2024-07-15 DIAGNOSIS — M25561 Pain in right knee: Secondary | ICD-10-CM | POA: Diagnosis not present

## 2024-08-15 ENCOUNTER — Ambulatory Visit: Admitting: Podiatry

## 2024-08-15 VITALS — Ht 61.0 in

## 2024-08-15 DIAGNOSIS — B351 Tinea unguium: Secondary | ICD-10-CM | POA: Diagnosis not present

## 2024-08-15 DIAGNOSIS — L600 Ingrowing nail: Secondary | ICD-10-CM | POA: Diagnosis not present

## 2024-08-15 DIAGNOSIS — M79675 Pain in left toe(s): Secondary | ICD-10-CM

## 2024-08-15 DIAGNOSIS — M79674 Pain in right toe(s): Secondary | ICD-10-CM | POA: Diagnosis not present

## 2024-08-15 NOTE — Progress Notes (Signed)
"  °  Subjective:  Patient ID: Adriana Franklin, female    DOB: 1941-03-27,  MRN: [id number]  Chief Complaint  Patient presents with   Ingrown Toenail    RM 5 Patient is here for nail trim and to evaluate the left hallux for possible ingrown.    84 y.o. female presents with the above complaint. History confirmed with patient.  Right hallux nail is also thickened and giving difficulty.  Objective:  Physical Exam: warm, good capillary refill, no trophic changes or ulcerative lesions, normal DP and PT pulses, normal sensory exam, and thickened mycotic bilateral hallux nails with incurvation and pincer nail deformity of left.  Assessment:     ICD-10-CM   1. Ingrowing left great toenail  L60.0     2. Pain due to onychomycosis of toenails of both feet  B35.1    M79.674    M79.675         Plan:  Patient was evaluated and treated and all questions answered.  Discussed the etiology and treatment options for the condition in detail with the patient. Recommended debridement of the nails today. Sharp and mechanical debridement performed of all painful and mycotic nails today. Nails debrided in length and thickness using a nail nipper to level of comfort. Did not require partial temporary or permanent matricectomy.  I advised her if it worsens or returns for her follow-up visit that she may return to me for this.  Return in about 3 months (around 11/13/2024) for painful thick fungal nails.   "

## 2024-11-13 ENCOUNTER — Ambulatory Visit: Admitting: Podiatry
